# Patient Record
Sex: Female | Born: 1995 | Race: Black or African American | Hispanic: No | Marital: Single | Smoking: Current some day smoker
Health system: Southern US, Community
[De-identification: ages and names within clinical notes are randomized; demographics above are authoritative.]

## PROBLEM LIST (undated history)

## (undated) DIAGNOSIS — J45909 Unspecified asthma, uncomplicated: Secondary | ICD-10-CM

---
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
## 2016-08-14 ENCOUNTER — Emergency Department (HOSPITAL_COMMUNITY)
Admission: EM | Admit: 2016-08-14 | Discharge: 2016-08-14 | Disposition: A | Discharge status: Home / Self Care | Payer: No Typology Code available for payment source | Attending: Emergency Medicine | Admitting: Emergency Medicine

## 2016-08-14 ENCOUNTER — Encounter (HOSPITAL_COMMUNITY): Payer: Self-pay

## 2016-08-14 DIAGNOSIS — K029 Dental caries, unspecified: Secondary | ICD-10-CM | POA: Insufficient documentation

## 2016-08-14 DIAGNOSIS — J45909 Unspecified asthma, uncomplicated: Secondary | ICD-10-CM | POA: Insufficient documentation

## 2016-08-14 HISTORY — DX: Unspecified asthma, uncomplicated: J45.909

## 2016-08-14 MED ORDER — AMOXICILLIN 500 MG PO CAPS
500.0000 mg | ORAL_CAPSULE | Freq: Once | ORAL | Status: AC
  Administered 2016-08-14: 500 mg via ORAL
  Filled 2016-08-14: qty 1

## 2016-08-14 MED ORDER — AMOXICILLIN 500 MG PO CAPS: 1000.0000 mg | ORAL_CAPSULE | Freq: Two times a day (BID) | ORAL | 0 refills | Status: DC

## 2016-08-14 MED ORDER — BUPIVACAINE-EPINEPHRINE (PF) 0.5% -1:200000 IJ SOLN
1.8000 mL | Freq: Once | INTRAMUSCULAR | Status: AC
  Administered 2016-08-14: 1.8 mL
  Filled 2016-08-14: qty 1.8

## 2016-08-14 MED ORDER — HYDROCODONE-ACETAMINOPHEN 5-325 MG PO TABS: ORAL_TABLET | ORAL | 0 refills | Status: AC

## 2016-08-14 NOTE — ED Provider Notes (Signed)
[facility]-EMERGENCY DEPT Provider Note   CSN: [id number] Arrival date & time: 08/14/16  1134  By signing my name below, I, Claudia Blackwell, attest that this documentation has been prepared under the direction and in the presence of [employer], PA-C.  Electronically Signed: Rosario Blackwell, ED Scribe. 08/14/16. 1:12 PM.  History   Chief Complaint Chief Complaint  Patient presents with  . Dental Pain   The history is provided by the patient. No language interpreter was used.    HPI Comments: Claudia Blackwell is a 21 y.o. female who presents to the Emergency Department complaining of persistent, gradually worsening, right-sided, lower dental pain beginning last night. Pt describes their pain as throbbing. She reports associated facial swelling over the right lower jaw since the onset of her pain. She has been taking BC Powder without relief of her pain. Pt's pain is exacerbated with opening the mouth. They are currently followed by a dental specialist whom she has f/u w/ for extraction of the tooth. Pt denies fever, chills, or any other associated symptoms.   Past Medical History:  Diagnosis Date  . Asthma    There are no active problems to display for this patient.  History reviewed. No pertinent surgical history.  OB History    No data available     Home Medications    Prior to Admission medications   Medication Sig Start Date End Date Taking? Authorizing Provider  amoxicillin (AMOXIL) 500 MG capsule Take 2 capsules (1,000 mg total) by mouth 2 (two) times daily. 08/14/16   Cody Oliger, Joni Reining, PA-C  diclofenac (CATAFLAM) 50 MG tablet Take 1 tablet (50 mg total) by mouth 3 (three) times daily. As needed for back pain 04/29/12   Linna Hoff, MD  HYDROcodone-acetaminophen (NORCO/VICODIN) 5-325 MG tablet Take 1-2 tablets by mouth every 6 hours as needed for pain. 08/14/16   Atwell Mcdanel, Mardella Layman   Family History No family history on file.  Social History Social  History  Substance Use Topics  . Smoking status: Never Smoker  . Smokeless tobacco: Never Used  . Alcohol use No   Allergies   Patient has no known allergies.  Review of Systems Review of Systems   A complete review of systems was obtained and all systems are negative except as noted in the HPI and PMH.   Physical Exam Updated Vital Signs BP 118/67 (BP Location: Right Arm)   Pulse 60   Temp 98.5 F (36.9 C) (Oral)   Resp 18   Ht 5\' 7"  (1.702 m)   Wt 59.9 kg (132 lb)   LMP 08/12/2016   SpO2 100%   BMI 20.67 kg/m   Physical Exam  Constitutional: She is oriented to person, place, and time. She appears well-developed and well-nourished. No distress.  HENT:  Head: Normocephalic and atraumatic.  Mouth/Throat: Uvula is midline and oropharynx is clear and moist. No trismus in the jaw. No uvula swelling. No oropharyngeal exudate, posterior oropharyngeal edema, posterior oropharyngeal erythema or tonsillar abscesses.  Generally poor dentition, no gingival swelling, erythema or tenderness to palpation. Patient is handling their secretions. There is no tenderness to palpation or firmness underneath tongue bilaterally. No trismus.    Eyes: Conjunctivae and EOM are normal.  Neck: Normal range of motion.  Cardiovascular: Normal rate.   Pulmonary/Chest: Effort normal. No stridor.  Abdominal: She exhibits no distension.  Musculoskeletal: Normal range of motion.  Lymphadenopathy:    She has no cervical adenopathy.  Neurological: She is alert and oriented  to person, place, and time.  Skin: No pallor.  Psychiatric: She has a normal mood and affect. Her behavior is normal.  Nursing note and vitals reviewed.  ED Treatments / Results  DIAGNOSTIC STUDIES: Oxygen Saturation is 100% on RA, normal by my interpretation.   COORDINATION OF CARE: 1:12 PM-Discussed next steps with pt. Pt verbalized understanding and is agreeable with the plan.   Labs (all labs ordered are listed, but only  abnormal results are displayed) Labs Reviewed - No data to display  EKG  EKG Interpretation None      Radiology No results found.  Procedures Dental Block Date/Time: 08/14/2016 1:18 PM Performed by: Wynetta Emery Authorized by: Wynetta Emery   Consent:    Consent obtained:  Verbal   Consent given by:  Patient   Risks discussed:  Hematoma, nerve damage and pain   Alternatives discussed:  No treatment and delayed treatment Universal protocol:    Procedure explained and questions answered to patient or proxy's satisfaction: yes     Relevant documents present and verified: yes     Test results available and properly labeled: yes     Imaging studies available: yes     Required blood products, implants, devices, and special equipment available: yes     Site/side marked: yes     Immediately prior to procedure, a time out was called: yes     Patient identity confirmed:  Verbally with patient, arm band and provided demographic data Indications:    Indications: dental pain   Location:    Block type:  Inferior alveolar   Laterality:  Right Procedure details (see MAR for exact dosages):    Syringe type:  Luer lock syringe   Needle gauge:  27 G   Anesthetic injected:  Bupivacaine 0.5% WITH epi   Injection procedure:  Anatomic landmarks identified, anatomic landmarks palpated, introduced needle, negative aspiration for blood and incremental injection Post-procedure details:    Outcome:  Pain relieved   Patient tolerance of procedure:  Tolerated well, no immediate complications     Medications Ordered in ED Medications  bupivacaine-epinephrine (MARCAINE W/ EPI) 0.5% -1:200000 injection 1.8 mL (1.8 mLs Infiltration Given by Other 08/14/16 1325)  amoxicillin (AMOXIL) capsule 500 mg (500 mg Oral Given 08/14/16 1330)   Initial Impression / Assessment and Plan / ED Course  I have reviewed the triage vital signs and the nursing notes.  Pertinent labs & imaging results that  were available during my care of the patient were reviewed by me and considered in my medical decision making (see chart for details).    Vitals:   08/14/16 1140 08/14/16 1141 08/14/16 1334  BP:  122/77 118/67  Pulse:  71 60  Resp:  18 18  Temp:  98.5 F (36.9 C)   TempSrc:  Oral   SpO2:  100% 100%  Weight: 59.9 kg (132 lb)    Height: 5\' 7"  (1.702 m)      Medications  bupivacaine-epinephrine (MARCAINE W/ EPI) 0.5% -1:200000 injection 1.8 mL (1.8 mLs Infiltration Given by Other 08/14/16 1325)  amoxicillin (AMOXIL) capsule 500 mg (500 mg Oral Given 08/14/16 1330)    JOSLYNNE KLATT is 21 y.o. female presenting with severe dental pain and extreme dental caries. She states that she has a dentist, there is no signs of deep space infection or systemic infection. Dental block given and patient started on amoxicillin.  Evaluation does not show pathology that would require ongoing emergent intervention or inpatient treatment. Pt is hemodynamically  stable and mentating appropriately. Discussed findings and plan with patient/guardian, who agrees with care plan. All questions answered. Return precautions discussed and outpatient follow up given.      Final Clinical Impressions(s) / ED Diagnoses   Final diagnoses:  Pain due to dental caries   New Prescriptions Discharge Medication List as of 08/14/2016  1:22 PM    START taking these medications   Details  amoxicillin (AMOXIL) 500 MG capsule Take 2 capsules (1,000 mg total) by mouth 2 (two) times daily., Starting Fri 08/14/2016, Print    HYDROcodone-acetaminophen (NORCO/VICODIN) 5-325 MG tablet Take 1-2 tablets by mouth every 6 hours as needed for pain., Print       I personally performed the services described in this documentation, which was scribed in my presence. The recorded information has been reviewed and is accurate.     Kaylyn Limisciotta, Anita Laguna, PA-C 08/15/16 [id number]    Vanetta MuldersZackowski, Scott, MD 08/27/16 [phone]

## 2016-08-14 NOTE — ED Notes (Signed)
Dental supplies at bedside.

## 2016-08-14 NOTE — Discharge Instructions (Signed)
Take vicodin for breakthrough pain, do not drink alcohol, drive, care for children or do other critical tasks while taking vicodin. ° °Please follow with your primary care doctor in the next 2 days for a check-up. They must obtain records for further management.  ° °Do not hesitate to return to the Emergency Department for any new, worsening or concerning symptoms.  ° °

## 2016-08-14 NOTE — ED Notes (Signed)
EDP at bedside  

## 2016-08-14 NOTE — ED Notes (Signed)
EDP made aware that dental supplies are ready.

## 2016-08-14 NOTE — ED Triage Notes (Signed)
Pt reports right lower dental pain, onset last night

## 2016-08-17 ENCOUNTER — Inpatient Hospital Stay (HOSPITAL_BASED_OUTPATIENT_CLINIC_OR_DEPARTMENT_OTHER)
Admission: EM | Admit: 2016-08-17 | Discharge: 2016-08-20 | DRG: 138 | Disposition: A | Discharge status: Home / Self Care | Payer: Self-pay | Attending: Internal Medicine | Admitting: Internal Medicine

## 2016-08-17 ENCOUNTER — Encounter (HOSPITAL_BASED_OUTPATIENT_CLINIC_OR_DEPARTMENT_OTHER): Payer: Self-pay | Admitting: Emergency Medicine

## 2016-08-17 ENCOUNTER — Emergency Department (HOSPITAL_BASED_OUTPATIENT_CLINIC_OR_DEPARTMENT_OTHER): Payer: Self-pay

## 2016-08-17 DIAGNOSIS — E876 Hypokalemia: Secondary | ICD-10-CM | POA: Diagnosis present

## 2016-08-17 DIAGNOSIS — K122 Cellulitis and abscess of mouth: Principal | ICD-10-CM | POA: Diagnosis present

## 2016-08-17 DIAGNOSIS — K047 Periapical abscess without sinus: Secondary | ICD-10-CM | POA: Diagnosis present

## 2016-08-17 LAB — BASIC METABOLIC PANEL
Anion gap: 10 (ref 5–15)
BUN: 12 mg/dL (ref 6–20)
CO2: 27 mmol/L (ref 22–32)
CREATININE: 0.75 mg/dL (ref 0.44–1.00)
Calcium: 9.6 mg/dL (ref 8.9–10.3)
Chloride: 98 mmol/L — ABNORMAL LOW (ref 101–111)
GFR calc Af Amer: >60 mL/min (ref >60)
Glucose, Bld: 93 mg/dL (ref 65–99)
Potassium: 3.3 mmol/L — ABNORMAL LOW (ref 3.5–5.1)
Sodium: 135 mmol/L (ref 135–145)

## 2016-08-17 LAB — CBC WITH DIFFERENTIAL/PLATELET
Basophils Absolute: 0 10*3/uL (ref 0.0–0.1)
Basophils Relative: 0 %
EOS PCT: 0 %
Eosinophils Absolute: 0 10*3/uL (ref 0.0–0.7)
HCT: 35.5 % — ABNORMAL LOW (ref 36.0–46.0)
Hemoglobin: 12.4 g/dL (ref 12.0–15.0)
LYMPHS ABS: 1 10*3/uL (ref 0.7–4.0)
Lymphocytes Relative: 8 %
MCH: 30.6 pg (ref 26.0–34.0)
MCHC: 34.9 g/dL (ref 30.0–36.0)
MCV: 87.7 fL (ref 78.0–100.0)
MONO ABS: 0.8 10*3/uL (ref 0.1–1.0)
MONOS PCT: 7 %
Neutro Abs: 9.7 10*3/uL — ABNORMAL HIGH (ref 1.7–7.7)
Neutrophils Relative %: 85 %
PLATELETS: 279 10*3/uL (ref 150–400)
RBC: 4.05 MIL/uL (ref 3.87–5.11)
RDW: 11.6 % (ref 11.5–15.5)
WBC: 11.5 10*3/uL — ABNORMAL HIGH (ref 4.0–10.5)

## 2016-08-17 MED ORDER — ACETAMINOPHEN 650 MG RE SUPP
650.0000 mg | Freq: Four times a day (QID) | RECTAL | Status: DC | PRN
Start: 2016-08-17 — End: 2016-08-20

## 2016-08-17 MED ORDER — POTASSIUM CHLORIDE CRYS ER 10 MEQ PO TBCR
30.0000 meq | EXTENDED_RELEASE_TABLET | Freq: Once | ORAL | Status: AC
  Administered 2016-08-17: 30 meq via ORAL
  Filled 2016-08-17: qty 1

## 2016-08-17 MED ORDER — MORPHINE SULFATE (PF) 4 MG/ML IV SOLN
4.0000 mg | INTRAVENOUS | Status: DC | PRN
  Administered 2016-08-17 (×2): 4 mg via INTRAVENOUS
  Filled 2016-08-17 (×2): qty 1

## 2016-08-17 MED ORDER — MORPHINE SULFATE (PF) 4 MG/ML IV SOLN
2.0000 mg | INTRAVENOUS | Status: DC | PRN
  Administered 2016-08-18: 4 mg via INTRAVENOUS
  Administered 2016-08-19 (×2): 2 mg via INTRAVENOUS
  Filled 2016-08-17 (×3): qty 1

## 2016-08-17 MED ORDER — ONDANSETRON HCL 4 MG/2ML IJ SOLN
4.0000 mg | Freq: Once | INTRAMUSCULAR | Status: AC
  Administered 2016-08-17: 4 mg via INTRAVENOUS
  Filled 2016-08-17: qty 2

## 2016-08-17 MED ORDER — POLYETHYLENE GLYCOL 3350 17 G PO PACK: 17.0000 g | PACK | Freq: Every day | ORAL | Status: DC | PRN

## 2016-08-17 MED ORDER — ACETAMINOPHEN 325 MG PO TABS
650.0000 mg | ORAL_TABLET | Freq: Four times a day (QID) | ORAL | Status: DC | PRN
  Administered 2016-08-20: 650 mg via ORAL
  Filled 2016-08-17: qty 2

## 2016-08-17 MED ORDER — ONDANSETRON HCL 4 MG/2ML IJ SOLN
4.0000 mg | Freq: Four times a day (QID) | INTRAMUSCULAR | Status: DC | PRN
  Administered 2016-08-18: 4 mg via INTRAVENOUS
  Filled 2016-08-17: qty 2

## 2016-08-17 MED ORDER — SODIUM CHLORIDE 0.9 % IV SOLN
3.0000 g | Freq: Four times a day (QID) | INTRAVENOUS | Status: DC
  Administered 2016-08-17 – 2016-08-20 (×11): 3 g via INTRAVENOUS
  Filled 2016-08-17 (×13): qty 3

## 2016-08-17 MED ORDER — CLINDAMYCIN PHOSPHATE 900 MG/50ML IV SOLN
900.0000 mg | Freq: Once | INTRAVENOUS | Status: AC
  Administered 2016-08-17: 900 mg via INTRAVENOUS
  Filled 2016-08-17: qty 50

## 2016-08-17 MED ORDER — IOPAMIDOL (ISOVUE-300) INJECTION 61%
100.0000 mL | Freq: Once | INTRAVENOUS | Status: AC | PRN
  Administered 2016-08-17: 75 mL via INTRAVENOUS

## 2016-08-17 MED ORDER — SODIUM CHLORIDE 0.9 % IV BOLUS (SEPSIS)
500.0000 mL | Freq: Once | INTRAVENOUS | Status: AC
  Administered 2016-08-17: 500 mL via INTRAVENOUS

## 2016-08-17 MED ORDER — SODIUM CHLORIDE 0.9 % IV SOLN: INTRAVENOUS | Status: AC

## 2016-08-17 MED ORDER — DEXAMETHASONE SODIUM PHOSPHATE 10 MG/ML IJ SOLN
10.0000 mg | Freq: Once | INTRAMUSCULAR | Status: AC
  Administered 2016-08-17: 10 mg via INTRAVENOUS
  Filled 2016-08-17: qty 1

## 2016-08-17 NOTE — ED Provider Notes (Signed)
[facility]   CSN: [id number] Arrival date & time: 08/17/16  1325     History   Chief Complaint Chief Complaint  Patient presents with  . Oral Swelling    HPI Claudia Blackwell is a 21 y.o. female. Chief complaint is mouth, and neck pain and swelling  HPI: is a 21 year old female seen and evaluated here 2 days ago with left lower mandibular molar tooth pain. Placed on amoxicillin which she has been taking 48 hours. Underwent an inferior alveolar nerve block. Presents today stating she has pain in the floor of her mouth. Was unable to swallow some soup at work. He is feeling like she has having trouble swallowing without pain. She is not drooling. She is not stridorous. She denies difficulty breathing. She prefers not to swallow due to pain but is able to.  Past Medical History:  Diagnosis Date  . Asthma     There are no active problems to display for this patient.   History reviewed. No pertinent surgical history.  OB History    No data available       Home Medications    Prior to Admission medications   Medication Sig Start Date End Date Taking? Authorizing Provider  amoxicillin (AMOXIL) 500 MG capsule Take 2 capsules (1,000 mg total) by mouth 2 (two) times daily. 08/14/16   Pisciotta, Joni Reining, PA-C  diclofenac (CATAFLAM) 50 MG tablet Take 1 tablet (50 mg total) by mouth 3 (three) times daily. As needed for back pain 04/29/12   Linna Hoff, MD  HYDROcodone-acetaminophen (NORCO/VICODIN) 5-325 MG tablet Take 1-2 tablets by mouth every 6 hours as needed for pain. 08/14/16   Pisciotta, Mardella Layman    Family History History reviewed. No pertinent family history.  Social History Social History  Substance Use Topics  . Smoking status: Never Smoker  . Smokeless tobacco: Never Used  . Alcohol use No     Allergies   Patient has no known allergies.   Review of Systems Review of Systems  Constitutional: Negative for appetite change,  chills, diaphoresis, fatigue and fever.  HENT: Negative for mouth sores, sore throat and trouble swallowing.        Mouth, and neck pain.  Eyes: Negative for visual disturbance.  Respiratory: Negative for cough, chest tightness, shortness of breath and wheezing.   Cardiovascular: Negative for chest pain.  Gastrointestinal: Negative for abdominal distention, abdominal pain, diarrhea, nausea and vomiting.  Endocrine: Negative for polydipsia, polyphagia and polyuria.  Genitourinary: Negative for dysuria, frequency and hematuria.  Musculoskeletal: Negative for gait problem.  Skin: Negative for color change, pallor and rash.  Neurological: Negative for dizziness, syncope, light-headedness and headaches.  Hematological: Does not bruise/bleed easily.  Psychiatric/Behavioral: Negative for behavioral problems and confusion.     Physical Exam Updated Vital Signs BP (!) 129/91 (BP Location: Right Arm)   Pulse 93   Temp 99.2 F (37.3 C) (Oral)   Resp (!) 22   Ht 5\' 7"  (1.702 m)   Wt 59.9 kg (132 lb)   LMP 08/12/2016   SpO2 99%   BMI 20.67 kg/m   Physical Exam  Constitutional: She is oriented to person, place, and time. She appears well-developed and well-nourished. No distress.  HENT:  Head: Normocephalic.    Mouth/Throat:    Eyes: Conjunctivae are normal. Pupils are equal, round, and reactive to light. No scleral icterus.  Neck: Normal range of motion. Neck supple. No thyromegaly present.  Cardiovascular: Normal rate and regular rhythm.  Exam reveals no gallop and no friction rub.   No murmur heard. Pulmonary/Chest: Effort normal and breath sounds normal. No respiratory distress. She has no wheezes. She has no rales.  Abdominal: Soft. Bowel sounds are normal. She exhibits no distension. There is no tenderness. There is no rebound.  Musculoskeletal: Normal range of motion.  Neurological: She is alert and oriented to person, place, and time.  Skin: Skin is warm and dry. No rash  noted.  Psychiatric: She has a normal mood and affect. Her behavior is normal.     ED Treatments / Results  Labs (all labs ordered are listed, but only abnormal results are displayed) Labs Reviewed  CBC WITH DIFFERENTIAL/PLATELET - Abnormal; Notable for the following:       Result Value   WBC 11.5 (*)    HCT 35.5 (*)    Neutro Abs 9.7 (*)    All other components within normal limits  BASIC METABOLIC PANEL - Abnormal; Notable for the following:    Potassium 3.3 (*)    Chloride 98 (*)    All other components within normal limits    EKG  EKG Interpretation None       Radiology Ct Soft Tissue Neck W Contrast  Result Date: 08/17/2016 CLINICAL DATA:  Recent dental procedure. Right jaw and neck swelling with pain. Right mandibular molar pain soft tissue swelling. Rule out Ludwig angina. EXAM: CT NECK WITH CONTRAST TECHNIQUE: Multidetector CT imaging of the neck was performed using the standard protocol following the bolus administration of intravenous contrast. CONTRAST:  75mL ISOVUE-300 IOPAMIDOL (ISOVUE-300) INJECTION 61% COMPARISON:  None. FINDINGS: Pharynx and larynx: Mild hypertrophy of the tonsils bilaterally. No peritonsillar abscess. No pharyngeal mass. Normal airway. Larynx normal. Salivary glands: No inflammation, mass, or stone. Thyroid: Negative Lymph nodes: Mild reactive adenopathy in the neck bilaterally, due to infection Vascular:  Arterial and venous patency is present Limited intracranial: Negative Visualized orbits: Negative Mastoids and visualized paranasal sinuses: Negative Skeleton: Caries right lower molar with minimal periapical lucency. No bony destruction. Caries also present left upper molar. No apparent apical abscess. Negative for osteomyelitis of the mandible. Upper chest: Negative Other: Soft tissue swelling in the floor of the mouth on the right. The mylohyoid is enlarged on the right. There is a small rim enhancing fluid collection in the posterior right sub  lingual space which may represent an abscess measuring approximately 5 x 15 mm. In addition, there is an enhancing submental lymph node measuring under 1 cm with mild stranding of the platysmas muscle and subcutaneous tissue in the right submandibular region. IMPRESSION: Soft tissue swelling floor of mouth on the right with enlargement of the right mylohyoid muscle. 5 x 15 mm rim enhancing fluid collection right posterior sublingual space consistent with small abscess. Findings compatible with floor of the mouth infection. These results were called by telephone at the time of interpretation on 08/17/2016 at 3:14 pm to Dr. Rolland Porter , who verbally acknowledged these results. Electronically Signed   By: Marlan Palau M.D.   On: 08/17/2016 15:15    Procedures Procedures (including critical care time)  Medications Ordered in ED Medications  morphine 4 MG/ML injection 4 mg (4 mg Intravenous Given 08/17/16 1526)  clindamycin (CLEOCIN) IVPB 900 mg (900 mg Intravenous New Bag/Given 08/17/16 1526)  dexamethasone (DECADRON) injection 10 mg (10 mg Intravenous Given 08/17/16 1524)  ondansetron (ZOFRAN) injection 4 mg (4 mg Intravenous Given 08/17/16 1524)  sodium chloride 0.9 % bolus 500 mL (500 mLs Intravenous  New Bag/Given 08/17/16 1527)  iopamidol (ISOVUE-300) 61 % injection 100 mL (75 mLs Intravenous Contrast Given 08/17/16 1449)     Initial Impression / Assessment and Plan / ED Course  I have reviewed the triage vital signs and the nursing notes.  Pertinent labs & imaging results that were available during my care of the patient were reviewed by me and considered in my medical decision making (see chart for details).    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 She has periapical abscess at base of right second molar. Not exactly intimate with this she has separate 5 mm, by 15 mm for mouth abscess. She has induration and phlegmon of mild chronic musculature. Airway is widely patent.  Upon  arrival. IV was placed. Given morphine and Zofran, Decadron, clindamycin 900 mg.  I discussed the case with oral surgeon Dr. Barbette MerinoJensen. He will see the patient consultation. He does request [facility] as [facility]. I placed a call to hospitalist regarding admission.  Final Clinical Impressions(s) / ED Diagnoses   Final diagnoses:  Ludwig's angina syndrome    New Prescriptions New Prescriptions   No medications on file     Rolland PorterJames, Kailon Treese, MD 08/17/16 1557

## 2016-08-17 NOTE — ED Notes (Signed)
Patient transported to CT 

## 2016-08-17 NOTE — Progress Notes (Signed)
    [facility] for treatment of oral cavity soft tissue abscess and significant soft tissue swelling with preservation of airway. EP, Dr. Rolland PorterMark James, has discussed case with oral surgery team who agreed to see the patient in consultation at time of arrival [facility] under observation status.   Shelly Flattenavid Merrell, MD [facility] 08/17/2016, 4:37 PM

## 2016-08-17 NOTE — H&P (Addendum)
History and Physical    Claudia Blackwell ZOX:[id number] DOB: 03-01-1996 DOA: 08/17/2016  PCP: Patient, No Pcp Per   Patient coming from: Home, by way of [facility]   Chief Complaint: Right mouth and jaw pain, fevers  HPI: Claudia Blackwell is a 21 y.o. female with medical history significant for asthma in childhood, now presenting in transfer from [facility] where she presented with pain and swelling at her right mandibular premolar region and under her chin. She also reports fevers for the past 2 nights. Patient states that she had been in her usual state of health until approximately 08/13/2016 when she noted severe pain and swelling at the lower right jaw and under her chin. She was evaluated in the emergency department the following day, given a dental block and discharged with amoxicillin and narcotic analgesia. Patient reports that she has been taking these medications as directed, but the pain and swelling has continued to worsen, making it very difficult for her to eat or drink due to pain. She denies any significant dyspnea and denies any noisy breathing. She reports fevers at home for the past 2 nights associated with chills. She denies any chest pain, palpitations, dyspnea, cough, or lightheadedness. No vomiting or diarrhea reported.  [facility] ED Course: Upon arrival to the [facility] ED, patient is found to be afebrile, saturating well on room air, and with vital signs stable. Chemistry panels notable for potassium of 3.3 and CBC features a mild leukocytosis with WBC 11,500. CT of the neck soft tissues reveal soft tissue swelling at the floor the mouth on the right side with enlarged right mild hyaloid muscle and rim-enhancing fluid collection at the right posterior sublingual space consistent with a small abscess. She was treated with IV clindamycin, Decadron 10 mg IV, morphine, Zofran, and 500 mL of normal saline in the ED. ED physician consulted with oral surgery  who advised a medical admission over to [facility] where they will see the patient in consultation. Patient has been transferred to [facility] where she continues to be hemodynamically stable and in no apparent respiratory distress. She will be observed on the medical/surgical unit for ongoing evaluation and management of oral abscess with fevers at home.  Review of Systems:  All other systems reviewed and apart from HPI, are negative.  Past Medical History:  Diagnosis Date  . Asthma     History reviewed. No pertinent surgical history.   reports that she has never smoked. She has never used smokeless tobacco. She reports that she does not drink alcohol or use drugs.  No Known Allergies  History reviewed. No pertinent family history.   Prior to Admission medications   Medication Sig Start Date End Date Taking? Authorizing Provider  amoxicillin (AMOXIL) 500 MG capsule Take 2 capsules (1,000 mg total) by mouth 2 (two) times daily. 08/14/16   Pisciotta, Joni Reining, PA-C  HYDROcodone-acetaminophen (NORCO/VICODIN) 5-325 MG tablet Take 1-2 tablets by mouth every 6 hours as needed for pain. 08/14/16   Pisciotta, Joni Reining, PA-C    Physical Exam: Vitals:   08/17/16 1331 08/17/16 1553 08/17/16 1728 08/17/16 1830  BP:  126/76 113/65 119/64  Pulse:  83 74 78  Resp:  18 16 16   Temp:    98.5 F (36.9 C)  TempSrc:    Axillary  SpO2:  100% 100% 100%  Weight: 59.9 kg (132 lb)     Height: 5\' 7"  (1.702 m)  Constitutional: NAD, calm, in apparent discomfort.  Eyes: PERTLA, lids and conjunctivae normal ENMT: Mucous membranes are moist. Unable to open mouth much secondary to pain. Swelling and tenderness at anterior inferior aspect of mandible, just right of center. No overlying cellulitis or drainage.    Neck: normal, supple, no masses, no thyromegaly Respiratory: clear to auscultation bilaterally, no wheezing, no crackles. Normal respiratory effort.   Cardiovascular: S1 & S2  heard, regular rate and rhythm. No extremity edema. No significant JVD. Abdomen: No distension, no tenderness, no masses palpated. Bowel sounds normal.  Musculoskeletal: no clubbing / cyanosis. No joint deformity upper and lower extremities.    Skin: no significant rashes, lesions, ulcers. Warm, dry, well-perfused. Neurologic: CN 2-12 grossly intact. Sensation intact, DTR normal. Strength 5/5 in all 4 limbs.  Psychiatric: Alert and oriented x 3. Calm and cooperative.     Labs on Admission: I have personally reviewed following labs and imaging studies  CBC:  Recent Labs Lab 08/17/16 1443  WBC 11.5*  NEUTROABS 9.7*  HGB 12.4  HCT 35.5*  MCV 87.7  PLT 279   Basic Metabolic Panel:  Recent Labs Lab 08/17/16 1443  NA 135  K 3.3*  CL 98*  CO2 27  GLUCOSE 93  BUN 12  CREATININE 0.75  CALCIUM 9.6   GFR: Estimated Creatinine Clearance: 104.3 mL/min (by C-G formula based on SCr of 0.75 mg/dL). Liver Function Tests: No results for input(s): AST, ALT, ALKPHOS, BILITOT, PROT, ALBUMIN in the last 168 hours. No results for input(s): LIPASE, AMYLASE in the last 168 hours. No results for input(s): AMMONIA in the last 168 hours. Coagulation Profile: No results for input(s): INR, PROTIME in the last 168 hours. Cardiac Enzymes: No results for input(s): CKTOTAL, CKMB, CKMBINDEX, TROPONINI in the last 168 hours. BNP (last 3 results) No results for input(s): PROBNP in the last 8760 hours. HbA1C: No results for input(s): HGBA1C in the last 72 hours. CBG: No results for input(s): GLUCAP in the last 168 hours. Lipid Profile: No results for input(s): CHOL, HDL, LDLCALC, TRIG, CHOLHDL, LDLDIRECT in the last 72 hours. Thyroid Function Tests: No results for input(s): TSH, T4TOTAL, FREET4, T3FREE, THYROIDAB in the last 72 hours. Anemia Panel: No results for input(s): VITAMINB12, FOLATE, FERRITIN, TIBC, IRON, RETICCTPCT in the last 72 hours. Urine analysis: No results found for:  COLORURINE, APPEARANCEUR, LABSPEC, PHURINE, GLUCOSEU, HGBUR, BILIRUBINUR, KETONESUR, PROTEINUR, UROBILINOGEN, NITRITE, LEUKOCYTESUR Sepsis Labs: @LABRCNTIP (procalcitonin:4,lacticidven:4) )No results found for this or any previous visit (from the past 240 hour(s)).   Radiological Exams on Admission: Ct Soft Tissue Neck W Contrast  Result Date: 08/17/2016 CLINICAL DATA:  Recent dental procedure. Right jaw and neck swelling with pain. Right mandibular molar pain soft tissue swelling. Rule out Ludwig angina. EXAM: CT NECK WITH CONTRAST TECHNIQUE: Multidetector CT imaging of the neck was performed using the standard protocol following the bolus administration of intravenous contrast. CONTRAST:  75mL ISOVUE-300 IOPAMIDOL (ISOVUE-300) INJECTION 61% COMPARISON:  None. FINDINGS: Pharynx and larynx: Mild hypertrophy of the tonsils bilaterally. No peritonsillar abscess. No pharyngeal mass. Normal airway. Larynx normal. Salivary glands: No inflammation, mass, or stone. Thyroid: Negative Lymph nodes: Mild reactive adenopathy in the neck bilaterally, due to infection Vascular:  Arterial and venous patency is present Limited intracranial: Negative Visualized orbits: Negative Mastoids and visualized paranasal sinuses: Negative Skeleton: Caries right lower molar with minimal periapical lucency. No bony destruction. Caries also present left upper molar. No apparent apical abscess. Negative for osteomyelitis of the mandible. Upper chest: Negative Other: Soft tissue  swelling in the floor of the mouth on the right. The mylohyoid is enlarged on the right. There is a small rim enhancing fluid collection in the posterior right sub lingual space which may represent an abscess measuring approximately 5 x 15 mm. In addition, there is an enhancing submental lymph node measuring under 1 cm with mild stranding of the platysmas muscle and subcutaneous tissue in the right submandibular region. IMPRESSION: Soft tissue swelling floor of  mouth on the right with enlargement of the right mylohyoid muscle. 5 x 15 mm rim enhancing fluid collection right posterior sublingual space consistent with small abscess. Findings compatible with floor of the mouth infection. These results were called by telephone at the time of interpretation on 08/17/2016 at 3:14 pm to Dr. Rolland PorterMARK JAMES , who verbally acknowledged these results. Electronically Signed   By: Marlan Palauharles  Clark M.D.   On: 08/17/2016 15:15    EKG: Not performed.   Assessment/Plan  1. Oral abscess  - Pt presents with pain and swelling at right anterior mandible, reports fever at home - She has been taking amoxicillin and Norco at home since 08/14/16 but reports worsening in sxs  - She is found to have mild leukocytosis, no fever here, CT with soft-tissue edema in floor or mouth on right with enlarged right myohyloid muscle and small abscess in right sublingual space  - Not classic Ludwig's as involvement is isolated to right side  - Airway is patent; no drooling or stridor, no distress  - She was treated with clindamycin 900 mg IV in ED  - Oral surgery is consulting and much appreciated; will follow-up on recommendations   - Plan to continue empiric abx with Unasyn, continue pain-control with prn morphine   2. Hypokalemia - Serum potassium is 3.3 on admission - She was treated with 30 mEq oral potassium  - Repeat chem panel in am     DVT prophylaxis: SCD's  Code Status: Full  Family Communication: Discussed with patient Disposition Plan: Observe on med-surg Consults called: Oral surgeon Admission status: Observation   Briscoe Deutscherimothy S Tazaria Dlugosz, MD [facility] Pager [phone]  If 7PM-7AM, please contact night-coverage [email or web address] Password [email or web address]  08/17/2016, 7:17 PM

## 2016-08-17 NOTE — ED Triage Notes (Signed)
Patient reports that she was seen and treated for a tooth problem earlier this week. She was given 2 medications and has been taking them. The patient reports that she woke up @ 4 am and took a vidcodin because of the tooth ache. She reports that she is also have swelling to her right jaw and to her neck. Patient states that while she was on her way to work she started to have some dizziness and thought is was because she had not eaten. When she got to work she was eating some soup and "it wouldn't go down"  and she felt like she was choking on it. Patient is taking deep breaths in Triage. No audible stridor noted

## 2016-08-18 ENCOUNTER — Encounter (HOSPITAL_COMMUNITY)
Admission: EM | Disposition: A | Discharge status: Home / Self Care | Payer: Self-pay | Source: Home / Self Care | Attending: Internal Medicine

## 2016-08-18 ENCOUNTER — Inpatient Hospital Stay (HOSPITAL_COMMUNITY): Payer: Self-pay | Admitting: Certified Registered Nurse Anesthetist

## 2016-08-18 ENCOUNTER — Encounter (HOSPITAL_COMMUNITY): Payer: Self-pay | Admitting: Certified Registered Nurse Anesthetist

## 2016-08-18 DIAGNOSIS — E876 Hypokalemia: Secondary | ICD-10-CM

## 2016-08-18 DIAGNOSIS — K122 Cellulitis and abscess of mouth: Secondary | ICD-10-CM

## 2016-08-18 HISTORY — PX: TOOTH EXTRACTION: SHX859

## 2016-08-18 HISTORY — PX: OTHER SURGICAL HISTORY: SHX169

## 2016-08-18 LAB — BASIC METABOLIC PANEL
Anion gap: 7 (ref 5–15)
BUN: 9 mg/dL (ref 6–20)
CHLORIDE: 103 mmol/L (ref 101–111)
CO2: 23 mmol/L (ref 22–32)
Calcium: 8.9 mg/dL (ref 8.9–10.3)
Creatinine, Ser: 0.72 mg/dL (ref 0.44–1.00)
GFR calc non Af Amer: >60 mL/min (ref >60)
Glucose, Bld: 123 mg/dL — ABNORMAL HIGH (ref 65–99)
POTASSIUM: 4.1 mmol/L (ref 3.5–5.1)
SODIUM: 133 mmol/L — AB (ref 135–145)

## 2016-08-18 LAB — CBC WITH DIFFERENTIAL/PLATELET
BASOS ABS: 0 10*3/uL (ref 0.0–0.1)
Basophils Relative: 0 %
EOS PCT: 0 %
Eosinophils Absolute: 0 10*3/uL (ref 0.0–0.7)
HCT: 32.2 % — ABNORMAL LOW (ref 36.0–46.0)
Hemoglobin: 10.6 g/dL — ABNORMAL LOW (ref 12.0–15.0)
LYMPHS ABS: 0.9 10*3/uL (ref 0.7–4.0)
LYMPHS PCT: 10 %
MCH: 28.7 pg (ref 26.0–34.0)
MCHC: 32.9 g/dL (ref 30.0–36.0)
MCV: 87.3 fL (ref 78.0–100.0)
Monocytes Absolute: 0.8 10*3/uL (ref 0.1–1.0)
Monocytes Relative: 9 %
NEUTROS PCT: 81 %
Neutro Abs: 7.1 10*3/uL (ref 1.7–7.7)
PLATELETS: 275 10*3/uL (ref 150–400)
RBC: 3.69 MIL/uL — AB (ref 3.87–5.11)
RDW: 11.8 % (ref 11.5–15.5)
WBC: 8.7 10*3/uL (ref 4.0–10.5)

## 2016-08-18 LAB — MAGNESIUM: MAGNESIUM: 1.6 mg/dL — AB (ref 1.7–2.4)

## 2016-08-18 LAB — HIV ANTIBODY (ROUTINE TESTING W REFLEX): HIV SCREEN 4TH GENERATION: NONREACTIVE

## 2016-08-18 SURGERY — EXTRACTION, TOOTH, MOLAR
Anesthesia: General

## 2016-08-18 MED ORDER — LACTATED RINGERS IV SOLN
INTRAVENOUS | Status: DC
  Administered 2016-08-18: 11:00:00 via INTRAVENOUS

## 2016-08-18 MED ORDER — LIDOCAINE 2% (20 MG/ML) 5 ML SYRINGE
INTRAMUSCULAR | Status: DC | PRN
  Administered 2016-08-18: 100 mg via INTRAVENOUS

## 2016-08-18 MED ORDER — KETOROLAC TROMETHAMINE 30 MG/ML IJ SOLN
30.0000 mg | Freq: Once | INTRAMUSCULAR | Status: DC | PRN
  Administered 2016-08-18: 30 mg via INTRAVENOUS

## 2016-08-18 MED ORDER — PROPOFOL 10 MG/ML IV BOLUS
INTRAVENOUS | Status: DC | PRN
  Administered 2016-08-18: 150 mg via INTRAVENOUS

## 2016-08-18 MED ORDER — FENTANYL CITRATE (PF) 250 MCG/5ML IJ SOLN
INTRAMUSCULAR | Status: DC | PRN
  Administered 2016-08-18: 125 ug via INTRAVENOUS
  Administered 2016-08-18: 25 ug via INTRAVENOUS

## 2016-08-18 MED ORDER — MIDAZOLAM HCL 2 MG/2ML IJ SOLN
INTRAMUSCULAR | Status: DC | PRN
  Administered 2016-08-18 (×2): 1 mg via INTRAVENOUS

## 2016-08-18 MED ORDER — SODIUM CHLORIDE 0.9 % IV SOLN
INTRAVENOUS | Status: AC
  Administered 2016-08-18 (×2): via INTRAVENOUS

## 2016-08-18 MED ORDER — FENTANYL CITRATE (PF) 100 MCG/2ML IJ SOLN
INTRAMUSCULAR | Status: AC
  Filled 2016-08-18: qty 2

## 2016-08-18 MED ORDER — PROMETHAZINE HCL 25 MG/ML IJ SOLN: 6.2500 mg | INTRAMUSCULAR | Status: DC | PRN

## 2016-08-18 MED ORDER — MIDAZOLAM HCL 2 MG/2ML IJ SOLN
INTRAMUSCULAR | Status: AC
  Filled 2016-08-18: qty 2

## 2016-08-18 MED ORDER — PROPOFOL 10 MG/ML IV BOLUS
INTRAVENOUS | Status: AC
Start: 2016-08-18 — End: 2016-08-18
  Filled 2016-08-18: qty 20

## 2016-08-18 MED ORDER — LIDOCAINE-EPINEPHRINE 2 %-1:100000 IJ SOLN
INTRAMUSCULAR | Status: DC | PRN
  Administered 2016-08-18: 20 mL via INTRADERMAL

## 2016-08-18 MED ORDER — OXYCODONE-ACETAMINOPHEN 5-325 MG PO TABS
1.0000 | ORAL_TABLET | ORAL | Status: DC | PRN
  Administered 2016-08-18: 2 via ORAL
  Filled 2016-08-18: qty 2

## 2016-08-18 MED ORDER — DEXAMETHASONE SODIUM PHOSPHATE 10 MG/ML IJ SOLN
INTRAMUSCULAR | Status: DC | PRN
  Administered 2016-08-18: 10 mg via INTRAVENOUS

## 2016-08-18 MED ORDER — OXYCODONE HCL 5 MG/5ML PO SOLN
5.0000 mg | ORAL | Status: DC | PRN
  Administered 2016-08-19 – 2016-08-20 (×4): 5 mg via ORAL
  Filled 2016-08-18 (×6): qty 5

## 2016-08-18 MED ORDER — LIDOCAINE-EPINEPHRINE 1 %-1:100000 IJ SOLN
INTRAMUSCULAR | Status: AC
  Filled 2016-08-18: qty 1

## 2016-08-18 MED ORDER — NEOSTIGMINE METHYLSULFATE 10 MG/10ML IV SOLN
INTRAVENOUS | Status: DC | PRN
  Administered 2016-08-18: 3 mg via INTRAVENOUS

## 2016-08-18 MED ORDER — GLYCOPYRROLATE 0.2 MG/ML IJ SOLN
INTRAMUSCULAR | Status: DC | PRN
  Administered 2016-08-18: 0.4 mg via INTRAVENOUS

## 2016-08-18 MED ORDER — SUCCINYLCHOLINE CHLORIDE 20 MG/ML IJ SOLN
INTRAMUSCULAR | Status: DC | PRN
  Administered 2016-08-18: 100 mg via INTRAVENOUS

## 2016-08-18 MED ORDER — ROCURONIUM BROMIDE 10 MG/ML (PF) SYRINGE
PREFILLED_SYRINGE | INTRAVENOUS | Status: DC | PRN
  Administered 2016-08-18: 20 mg via INTRAVENOUS

## 2016-08-18 MED ORDER — ONDANSETRON HCL 4 MG/2ML IJ SOLN
INTRAMUSCULAR | Status: DC | PRN
  Administered 2016-08-18: 4 mg via INTRAVENOUS

## 2016-08-18 MED ORDER — FENTANYL CITRATE (PF) 100 MCG/2ML IJ SOLN
25.0000 ug | INTRAMUSCULAR | Status: DC | PRN
  Administered 2016-08-18: 50 ug via INTRAVENOUS

## 2016-08-18 MED ORDER — KETOROLAC TROMETHAMINE 30 MG/ML IJ SOLN
INTRAMUSCULAR | Status: AC
  Filled 2016-08-18: qty 1

## 2016-08-18 MED ORDER — MEPERIDINE HCL 25 MG/ML IJ SOLN: 6.2500 mg | INTRAMUSCULAR | Status: DC | PRN

## 2016-08-18 MED ORDER — SODIUM CHLORIDE 0.9 % IR SOLN
Status: DC | PRN
  Administered 2016-08-18: 1

## 2016-08-18 MED ORDER — OXYMETAZOLINE HCL 0.05 % NA SOLN
NASAL | Status: AC
  Filled 2016-08-18: qty 15

## 2016-08-18 MED ORDER — FENTANYL CITRATE (PF) 250 MCG/5ML IJ SOLN
INTRAMUSCULAR | Status: AC
  Filled 2016-08-18: qty 5

## 2016-08-18 SURGICAL SUPPLY — 34 items
BLADE SURG 15 STRL LF DISP TIS (BLADE) ×1 IMPLANT
BLADE SURG 15 STRL SS (BLADE) ×3
BUR CROSS CUT FISSURE 1.6 (BURR) ×2 IMPLANT
BUR CROSS CUT FISSURE 1.6MM (BURR) ×1
CANISTER SUCT 3000ML PPV (MISCELLANEOUS) ×3 IMPLANT
COVER SURGICAL LIGHT HANDLE (MISCELLANEOUS) ×3 IMPLANT
DECANTER SPIKE VIAL GLASS SM (MISCELLANEOUS) IMPLANT
DRAIN PENROSE 1/4X12 LTX STRL (WOUND CARE) ×2 IMPLANT
GAUZE PACKING FOLDED 2  STR (GAUZE/BANDAGES/DRESSINGS) ×2
GAUZE PACKING FOLDED 2 STR (GAUZE/BANDAGES/DRESSINGS) ×1 IMPLANT
GAUZE SPONGE 4X4 16PLY XRAY LF (GAUZE/BANDAGES/DRESSINGS) IMPLANT
GLOVE BIO SURGEON STRL SZ 6.5 (GLOVE) ×2 IMPLANT
GLOVE BIO SURGEON STRL SZ7.5 (GLOVE) ×3 IMPLANT
GLOVE BIO SURGEONS STRL SZ 6.5 (GLOVE) ×1
GLOVE BIOGEL PI IND STRL 7.0 (GLOVE) ×1 IMPLANT
GLOVE BIOGEL PI INDICATOR 7.0 (GLOVE) ×2
GOWN STRL REUS W/ TWL LRG LVL3 (GOWN DISPOSABLE) ×1 IMPLANT
GOWN STRL REUS W/ TWL XL LVL3 (GOWN DISPOSABLE) ×1 IMPLANT
GOWN STRL REUS W/TWL LRG LVL3 (GOWN DISPOSABLE) ×3
GOWN STRL REUS W/TWL XL LVL3 (GOWN DISPOSABLE) ×3
KIT BASIN OR (CUSTOM PROCEDURE TRAY) ×3 IMPLANT
KIT ROOM TURNOVER OR (KITS) ×3 IMPLANT
NEEDLE 22X1 1/2 (OR ONLY) (NEEDLE) ×3 IMPLANT
NS IRRIG 1000ML POUR BTL (IV SOLUTION) ×3 IMPLANT
PAD ARMBOARD 7.5X6 YLW CONV (MISCELLANEOUS) ×6 IMPLANT
SUT CHROMIC 3 0 PS 2 (SUTURE) ×6 IMPLANT
SUT SILK 3 0 SH 30 (SUTURE) ×2 IMPLANT
SWAB CULTURE LIQ STUART DBL (MISCELLANEOUS) ×2 IMPLANT
SWAB CULTURE LIQUID MINI MALE (MISCELLANEOUS) ×2 IMPLANT
SYR CONTROL 10ML LL (SYRINGE) ×3 IMPLANT
TOWEL OR 17X26 10 PK STRL BLUE (TOWEL DISPOSABLE) ×3 IMPLANT
TRAY ENT MC OR (CUSTOM PROCEDURE TRAY) ×3 IMPLANT
TUBING IRRIGATION (MISCELLANEOUS) IMPLANT
YANKAUER SUCT BULB TIP NO VENT (SUCTIONS) ×3 IMPLANT

## 2016-08-18 NOTE — Op Note (Signed)
NAME:  Claudia Blackwell, Claudia Blackwell           ACCOUNT NO.:  [id number]  MEDICAL RECORD NO.:  [id number]  LOCATION:  [id number]                         FACILITY:  [facility]  PHYSICIAN:  Georgia LopesScott M. Nakina Spatz, M.D.  DATE OF BIRTH:  1996-02-22  DATE OF PROCEDURE:  08/18/2016 DATE OF DISCHARGE:                              OPERATIVE REPORT   PREOPERATIVE DIAGNOSIS:  Right sublingual space infection, abscess, tooth #31.  POSTOPERATIVE DIAGNOSIS:  Right sublingual space infection, abscess, tooth #31.  PROCEDURES PERFORMED:  Extraction of tooth #31.  Incision and drainage of right sublingual space infection.  SURGEON:  Georgia LopesScott M. Timiko Offutt, M.D.  ANESTHESIA:  General nasal intubation.  DESCRIPTION OF PROCEDURE:  The patient was taken to the operating room, placed on the table in supine position.  General anesthesia was administered and an oral endotracheal tube was placed and secured.  The eyes were protected.  The patient was draped for the procedure.  Time- out was performed.  The posterior pharynx was suctioned.  A throat pack was placed.  A 2% lidocaine with 1:100,000 epinephrine was infiltrated in an inferior alveolar block in the right mandible and buccally and lingually around tooth #31 as well as in the lingual sulcus adjacent to tooth #31.  A #15 blade was used to make an incision around tooth #31. The periosteum was elevated with a periosteal elevator and then the tooth was elevated with a 301 dental elevator and removed from the mouth with a #17 forceps.  Upon removal of the tooth, a small amount of purulent exudate was noted in the distal socket.  Decision was made to continue with incision and drainage.  A 15 blade was used to make a mucosal incision lingually adjacent to tooth #31 in the floor of the mouth.  The hemostats were used to dissect bluntly until purulent exudate was encountered.  This was sent for cultures, aerobic and anaerobic.  The area was manipulated until it was felt that all  the purulence had been removed.  Then, the area was irrigated and a quarter- inch Penrose was placed into the infected site and sutured to the overlying mucosa with 3-0 silk.  Then, the oral cavity was irrigated and suctioned.  Throat pack was removed.  The patient was left in the care of anesthesia for awakening and transportation to recovery room with return to the floor to follow.  ESTIMATED BLOOD LOSS:  Minimal.  COMPLICATIONS:  None.  SPECIMEN:  None.  DRAINS:  Quarter-inch Penrose drain.     Georgia LopesScott M. Falcon Mccaskey, M.D.     SMJ/MEDQ  D:  08/18/2016  T:  08/18/2016  Job:  [id number]

## 2016-08-18 NOTE — Anesthesia Preprocedure Evaluation (Addendum)
Anesthesia Evaluation  Patient identified by MRN, date of birth, ID band Patient awake    Reviewed: Allergy & Precautions, NPO status , Patient's Chart, lab work & pertinent test results  Airway Mallampati: I       Dental no notable dental hx. (+) Teeth Intact   Pulmonary    Pulmonary exam normal breath sounds clear to auscultation       Cardiovascular negative cardio ROS Normal cardiovascular exam Rhythm:Regular Rate:Normal     Neuro/Psych negative neurological ROS  negative psych ROS   GI/Hepatic negative GI ROS, Neg liver ROS,   Endo/Other  negative endocrine ROS  Renal/GU negative Renal ROS  negative genitourinary   Musculoskeletal negative musculoskeletal ROS (+)   Abdominal Normal abdominal exam  (+)   Peds  Hematology  (+) Blood dyscrasia, anemia ,   Anesthesia Other Findings   Reproductive/Obstetrics                            Anesthesia Physical Anesthesia Plan  ASA: II  Anesthesia Plan: General   Post-op Pain Management:    Induction: Intravenous  PONV Risk Score and Plan: 3 and Ondansetron, Dexamethasone, Propofol and Midazolam  Airway Management Planned: Oral ETT  Additional Equipment:   Intra-op Plan:   Post-operative Plan: Extubation in OR  Informed Consent: I have reviewed the patients History and Physical, chart, labs and discussed the procedure including the risks, benefits and alternatives for the proposed anesthesia with the patient or authorized representative who has indicated his/her understanding and acceptance.   Dental advisory given  Plan Discussed with: CRNA and Surgeon  Anesthesia Plan Comments:         Anesthesia Quick Evaluation

## 2016-08-18 NOTE — Progress Notes (Signed)
[facility] PROGRESS NOTE  Claudia Blackwell AVW:[id number] DOB: 27-Dec-1995 DOA: 08/17/2016   PCP: Patient, No Pcp Per     Assessment/Plan: Principal Problem:   Oral abscess Active Problems:   Hypokalemia   Ludwig's angina syndrome   21 year old female , who presented with right lower molar pain and swelling under the tongue Seen at [facility] approx 08/13/2016 for pain and swelling right floor of mouth/jaw. Rx'd amoxicillin and pain meds. Swelling continued to worsen. Seen yesterday at [facility] and diagnosed with "Ludwigs Angina Syndrome". Since then has been given IV cleocin/Unasyn and swelling has reduced markedly. Seen by oral surgery  Assessment/Plan  1. Oral abscess  - Pt presents with pain and swelling at right anterior mandible,  Afebrile since admission - She has been taking amoxicillin and Norco at home since 08/14/16 but reports worsening in sxs  - Leukocytosis improved 11.5>8.7, CT scan shows soft tissue edema in floor or mouth on right with enlarged right myohyloid muscle and small abscess in right sublingual space  -Patient has been evaluated by oral surgery Dr. Barbette Merino, he recommends incision and drainage 6/19 - Continue IV Unasyn, day #2 - She was treated with clindamycin 900 mg IV in ED     2. Hypokalemia Repleted  DVT prophylaxsis Lovenox.  After surgery  Code Status:  Full code   Family Communication: Discussed in detail with the patient, all imaging results, lab results explained to the patient   Disposition Plan:  1-2 days      Consultants:  Oral surgery  Procedures:  None  Antibiotics: Anti-infectives    Start     Dose/Rate Route Frequency Ordered Stop   08/17/16 1930  Ampicillin-Sulbactam (UNASYN) 3 g in sodium chloride 0.9 % 100 mL IVPB     3 g 200 mL/hr over 30 Minutes Intravenous Every 6 hours 08/17/16 1854     08/17/16 1430  clindamycin (CLEOCIN) IVPB 900 mg     900 mg 100 mL/hr over 30 Minutes  Intravenous  Once 08/17/16 1416 08/17/16 1724         HPI/Subjective: Complaining of pain and swelling in the right lower molar  Objective: Vitals:   08/17/16 1728 08/17/16 1830 08/17/16 2300 08/18/16 0500  BP: 113/65 119/64 (!) 113/56 109/65  Pulse: 74 78 69 (!) 51  Resp: 16 16 16 16   Temp:  98.5 F (36.9 C) 98.6 F (37 C) 97.7 F (36.5 C)  TempSrc:  Axillary Oral Oral  SpO2: 100% 100% 94% 94%  Weight:      Height:        Intake/Output Summary (Last 24 hours) at 08/18/16 0905 Last data filed at 08/18/16 0400  Gross per 24 hour  Intake          1183.33 ml  Output                0 ml  Net          1183.33 ml    Exam:  Examination:  General exam:  Mild tenderness in the right submandibular area Respiratory system: Clear to auscultation. Respiratory effort normal. Cardiovascular system: S1 & S2 heard, RRR. No JVD, murmurs, rubs, gallops or clicks. No pedal edema. Gastrointestinal system: Abdomen is nondistended, soft and nontender. No organomegaly or masses felt. Normal bowel sounds heard. Central nervous system: Alert and oriented. No focal neurological deficits. Extremities: Symmetric 5 x 5 power. Skin: No rashes, lesions or ulcers Psychiatry: Judgement and  insight appear normal. Mood & affect appropriate.     Data Reviewed: I have personally reviewed following labs and imaging studies  Micro Results No results found for this or any previous visit (from the past 240 hour(s)).  Radiology Reports Ct Soft Tissue Neck W Contrast  Result Date: 08/17/2016 CLINICAL DATA:  Recent dental procedure. Right jaw and neck swelling with pain. Right mandibular molar pain soft tissue swelling. Rule out Ludwig angina. EXAM: CT NECK WITH CONTRAST TECHNIQUE: Multidetector CT imaging of the neck was performed using the standard protocol following the bolus administration of intravenous contrast. CONTRAST:  75mL ISOVUE-300 IOPAMIDOL (ISOVUE-300) INJECTION 61% COMPARISON:  None.  FINDINGS: Pharynx and larynx: Mild hypertrophy of the tonsils bilaterally. No peritonsillar abscess. No pharyngeal mass. Normal airway. Larynx normal. Salivary glands: No inflammation, mass, or stone. Thyroid: Negative Lymph nodes: Mild reactive adenopathy in the neck bilaterally, due to infection Vascular:  Arterial and venous patency is present Limited intracranial: Negative Visualized orbits: Negative Mastoids and visualized paranasal sinuses: Negative Skeleton: Caries right lower molar with minimal periapical lucency. No bony destruction. Caries also present left upper molar. No apparent apical abscess. Negative for osteomyelitis of the mandible. Upper chest: Negative Other: Soft tissue swelling in the floor of the mouth on the right. The mylohyoid is enlarged on the right. There is a small rim enhancing fluid collection in the posterior right sub lingual space which may represent an abscess measuring approximately 5 x 15 mm. In addition, there is an enhancing submental lymph node measuring under 1 cm with mild stranding of the platysmas muscle and subcutaneous tissue in the right submandibular region. IMPRESSION: Soft tissue swelling floor of mouth on the right with enlargement of the right mylohyoid muscle. 5 x 15 mm rim enhancing fluid collection right posterior sublingual space consistent with small abscess. Findings compatible with floor of the mouth infection. These results were called by telephone at the time of interpretation on 08/17/2016 at 3:14 pm to Dr. Rolland PorterMARK JAMES , who verbally acknowledged these results. Electronically Signed   By: Marlan Palauharles  Clark M.D.   On: 08/17/2016 15:15     CBC  Recent Labs Lab 08/17/16 1443 08/18/16 0359  WBC 11.5* 8.7  HGB 12.4 10.6*  HCT 35.5* 32.2*  PLT 279 275  MCV 87.7 87.3  MCH 30.6 28.7  MCHC 34.9 32.9  RDW 11.6 11.8  LYMPHSABS 1.0 0.9  MONOABS 0.8 0.8  EOSABS 0.0 0.0  BASOSABS 0.0 0.0    Chemistries   Recent Labs Lab 08/17/16 1443  08/18/16 0359  NA 135 133*  K 3.3* 4.1  CL 98* 103  CO2 27 23  GLUCOSE 93 123*  BUN 12 9  CREATININE 0.75 0.72  CALCIUM 9.6 8.9  MG  --  1.6*   ------------------------------------------------------------------------------------------------------------------ estimated creatinine clearance is 104.3 mL/min (by C-G formula based on SCr of 0.72 mg/dL). ------------------------------------------------------------------------------------------------------------------ No results for input(s): HGBA1C in the last 72 hours. ------------------------------------------------------------------------------------------------------------------ No results for input(s): CHOL, HDL, LDLCALC, TRIG, CHOLHDL, LDLDIRECT in the last 72 hours. ------------------------------------------------------------------------------------------------------------------ No results for input(s): TSH, T4TOTAL, T3FREE, THYROIDAB in the last 72 hours.  Invalid input(s): FREET3 ------------------------------------------------------------------------------------------------------------------ No results for input(s): VITAMINB12, FOLATE, FERRITIN, TIBC, IRON, RETICCTPCT in the last 72 hours.  Coagulation profile No results for input(s): INR, PROTIME in the last 168 hours.  No results for input(s): DDIMER in the last 72 hours.  Cardiac Enzymes No results for input(s): CKMB, TROPONINI, MYOGLOBIN in the last 168 hours.  Invalid input(s): CK ------------------------------------------------------------------------------------------------------------------ Invalid input(s): POCBNP  CBG: No results for input(s): GLUCAP in the last 168 hours.     Studies: Ct Soft Tissue Neck W Contrast  Result Date: 08/17/2016 CLINICAL DATA:  Recent dental procedure. Right jaw and neck swelling with pain. Right mandibular molar pain soft tissue swelling. Rule out Ludwig angina. EXAM: CT NECK WITH CONTRAST TECHNIQUE: Multidetector CT imaging  of the neck was performed using the standard protocol following the bolus administration of intravenous contrast. CONTRAST:  75mL ISOVUE-300 IOPAMIDOL (ISOVUE-300) INJECTION 61% COMPARISON:  None. FINDINGS: Pharynx and larynx: Mild hypertrophy of the tonsils bilaterally. No peritonsillar abscess. No pharyngeal mass. Normal airway. Larynx normal. Salivary glands: No inflammation, mass, or stone. Thyroid: Negative Lymph nodes: Mild reactive adenopathy in the neck bilaterally, due to infection Vascular:  Arterial and venous patency is present Limited intracranial: Negative Visualized orbits: Negative Mastoids and visualized paranasal sinuses: Negative Skeleton: Caries right lower molar with minimal periapical lucency. No bony destruction. Caries also present left upper molar. No apparent apical abscess. Negative for osteomyelitis of the mandible. Upper chest: Negative Other: Soft tissue swelling in the floor of the mouth on the right. The mylohyoid is enlarged on the right. There is a small rim enhancing fluid collection in the posterior right sub lingual space which may represent an abscess measuring approximately 5 x 15 mm. In addition, there is an enhancing submental lymph node measuring under 1 cm with mild stranding of the platysmas muscle and subcutaneous tissue in the right submandibular region. IMPRESSION: Soft tissue swelling floor of mouth on the right with enlargement of the right mylohyoid muscle. 5 x 15 mm rim enhancing fluid collection right posterior sublingual space consistent with small abscess. Findings compatible with floor of the mouth infection. These results were called by telephone at the time of interpretation on 08/17/2016 at 3:14 pm to Dr. Rolland Porter , who verbally acknowledged these results. Electronically Signed   By: Marlan Palau M.D.   On: 08/17/2016 15:15      No results found for: HGBA1C Lab Results  Component Value Date   CREATININE 0.72 08/18/2016       Scheduled  Meds: Continuous Infusions: . ampicillin-sulbactam (UNASYN) IV 3 g (08/18/16 0832)     LOS: 0 days    Time spent: >30 MINS    Richarda Overlie  [facility] Pager [phone]. If 7PM-7AM, please contact night-coverage at [email or web address], password [facility] 08/18/2016, 9:05 AM  LOS: 0 days

## 2016-08-18 NOTE — Consult Note (Signed)
Claudia Blackwell is an 21 y.o. female.  CC: pain/swelling right lower molar, swelling under tongue   HPI: Seen at [facility] approx 08/13/2016 for pain and swelling right floor of mouth/jaw. Rx'd amoxicillin and pain meds. Swelling continued to worsen. Seen yesterday at [facility] and diagnosed with "Ludwigs Angina Syndrome". Since then has been given IV cleocin/Unasyn and swelling has reduced markedly.  Past Medical History:  Diagnosis Date  . Asthma     History reviewed. No pertinent surgical history.  History reviewed. No pertinent family history.  Social History:  reports that she has never smoked. She has never used smokeless tobacco. She reports that she does not drink alcohol or use drugs.  Allergies: No Known Allergies  Medications: I have reviewed the patient's current medications.  Results for orders placed or performed during the hospital encounter of 08/17/16 (from the past 48 hour(s))  CBC with Differential/Platelet     Status: Abnormal   Collection Time: 08/17/16  2:43 PM  Result Value Ref Range   WBC 11.5 (H) 4.0 - 10.5 K/uL   RBC 4.05 3.87 - 5.11 MIL/uL   Hemoglobin 12.4 12.0 - 15.0 g/dL   HCT 35.5 (L) 36.0 - 46.0 %   MCV 87.7 78.0 - 100.0 fL   MCH 30.6 26.0 - 34.0 pg   MCHC 34.9 30.0 - 36.0 g/dL   RDW 11.6 11.5 - 15.5 %   Platelets 279 150 - 400 K/uL   Neutrophils Relative % 85 %   Neutro Abs 9.7 (H) 1.7 - 7.7 K/uL   Lymphocytes Relative 8 %   Lymphs Abs 1.0 0.7 - 4.0 K/uL   Monocytes Relative 7 %   Monocytes Absolute 0.8 0.1 - 1.0 K/uL   Eosinophils Relative 0 %   Eosinophils Absolute 0.0 0.0 - 0.7 K/uL   Basophils Relative 0 %   Basophils Absolute 0.0 0.0 - 0.1 K/uL  Basic metabolic panel     Status: Abnormal   Collection Time: 08/17/16  2:43 PM  Result Value Ref Range   Sodium 135 135 - 145 mmol/L   Potassium 3.3 (L) 3.5 - 5.1 mmol/L   Chloride 98 (L) 101 - 111 mmol/L   CO2 27 22 - 32 mmol/L   Glucose, Bld 93 65 - 99  mg/dL   BUN 12 6 - 20 mg/dL   Creatinine, Ser 0.75 0.44 - 1.00 mg/dL   Calcium 9.6 8.9 - 10.3 mg/dL   GFR calc non Af Amer >60 >60 mL/min   GFR calc Af Amer >60 >60 mL/min    Comment: (NOTE) The eGFR has been calculated using the CKD EPI equation. This calculation has not been validated in all clinical situations. eGFR's persistently <60 mL/min signify possible Chronic Kidney Disease.    Anion gap 10 5 - 15  Basic metabolic panel     Status: Abnormal   Collection Time: 08/18/16  3:59 AM  Result Value Ref Range   Sodium 133 (L) 135 - 145 mmol/L   Potassium 4.1 3.5 - 5.1 mmol/L   Chloride 103 101 - 111 mmol/L   CO2 23 22 - 32 mmol/L   Glucose, Bld 123 (H) 65 - 99 mg/dL   BUN 9 6 - 20 mg/dL   Creatinine, Ser 0.72 0.44 - 1.00 mg/dL   Calcium 8.9 8.9 - 10.3 mg/dL   GFR calc non Af Amer >60 >60 mL/min   GFR calc Af Amer >60 >60 mL/min    Comment: (NOTE)  The eGFR has been calculated using the CKD EPI equation. This calculation has not been validated in all clinical situations. eGFR's persistently <60 mL/min signify possible Chronic Kidney Disease.    Anion gap 7 5 - 15  CBC WITH DIFFERENTIAL     Status: Abnormal   Collection Time: 08/18/16  3:59 AM  Result Value Ref Range   WBC 8.7 4.0 - 10.5 K/uL   RBC 3.69 (L) 3.87 - 5.11 MIL/uL   Hemoglobin 10.6 (L) 12.0 - 15.0 g/dL   HCT 32.2 (L) 36.0 - 46.0 %   MCV 87.3 78.0 - 100.0 fL   MCH 28.7 26.0 - 34.0 pg   MCHC 32.9 30.0 - 36.0 g/dL   RDW 11.8 11.5 - 15.5 %   Platelets 275 150 - 400 K/uL   Neutrophils Relative % 81 %   Neutro Abs 7.1 1.7 - 7.7 K/uL   Lymphocytes Relative 10 %   Lymphs Abs 0.9 0.7 - 4.0 K/uL   Monocytes Relative 9 %   Monocytes Absolute 0.8 0.1 - 1.0 K/uL   Eosinophils Relative 0 %   Eosinophils Absolute 0.0 0.0 - 0.7 K/uL   Basophils Relative 0 %   Basophils Absolute 0.0 0.0 - 0.1 K/uL  Magnesium     Status: Abnormal   Collection Time: 08/18/16  3:59 AM  Result Value Ref Range   Magnesium 1.6 (L) 1.7 -  2.4 mg/dL    Ct Soft Tissue Neck W Contrast  Result Date: 08/17/2016 CLINICAL DATA:  Recent dental procedure. Right jaw and neck swelling with pain. Right mandibular molar pain soft tissue swelling. Rule out Ludwig angina. EXAM: CT NECK WITH CONTRAST TECHNIQUE: Multidetector CT imaging of the neck was performed using the standard protocol following the bolus administration of intravenous contrast. CONTRAST:  34m ISOVUE-300 IOPAMIDOL (ISOVUE-300) INJECTION 61% COMPARISON:  None. FINDINGS: Pharynx and larynx: Mild hypertrophy of the tonsils bilaterally. No peritonsillar abscess. No pharyngeal mass. Normal airway. Larynx normal. Salivary glands: No inflammation, mass, or stone. Thyroid: Negative Lymph nodes: Mild reactive adenopathy in the neck bilaterally, due to infection Vascular:  Arterial and venous patency is present Limited intracranial: Negative Visualized orbits: Negative Mastoids and visualized paranasal sinuses: Negative Skeleton: Caries right lower molar with minimal periapical lucency. No bony destruction. Caries also present left upper molar. No apparent apical abscess. Negative for osteomyelitis of the mandible. Upper chest: Negative Other: Soft tissue swelling in the floor of the mouth on the right. The mylohyoid is enlarged on the right. There is a small rim enhancing fluid collection in the posterior right sub lingual space which may represent an abscess measuring approximately 5 x 15 mm. In addition, there is an enhancing submental lymph node measuring under 1 cm with mild stranding of the platysmas muscle and subcutaneous tissue in the right submandibular region. IMPRESSION: Soft tissue swelling floor of mouth on the right with enlargement of the right mylohyoid muscle. 5 x 15 mm rim enhancing fluid collection right posterior sublingual space consistent with small abscess. Findings compatible with floor of the mouth infection. These results were called by telephone at the time of  interpretation on 08/17/2016 at 3:14 pm to Dr. MTanna Furry, who verbally acknowledged these results. Electronically Signed   By: CFranchot GalloM.D.   On: 08/17/2016 15:15    ROS Blood pressure 109/65, pulse (!) 51, temperature 97.7 F (36.5 C), temperature source Oral, resp. rate 16, height _0  (1.702 m), weight 132 lb (59.9 kg), last menstrual period 08/12/2016, SpO2  94 %. General appearance: alert, cooperative and no distress Head: Normocephalic, without obvious abnormality, atraumatic Eyes: negative Nose: Nares normal. Septum midline. Mucosa normal. No drainage or sinus tenderness. Throat: Mild-moderate right sublingual edema. Carious molar right mandible. No purulent drainage. Pharynx clear. Mild trismus to 85m. Neck: no adenopathy, supple, symmetrical, trachea midline and mild tenderness right submandibular area.   Assessment/Plan: Right sublingual space abscess secondary to carious molar. Will need extraction of tooth with possible incision and drainage.  Continue antibiotics. Scheduled for OR 11:30am today.  Levora Werden M 08/18/2016, 7:49 AM

## 2016-08-18 NOTE — Op Note (Signed)
08/17/2016 - 08/18/2016  11:44 AM  PATIENT:  Christie NottinghamJaquasia M Mizuno  21 y.o. female  PRE-OPERATIVE DIAGNOSIS: Right sublingual space infection, abscessed tooth # 31  POST-OPERATIVE DIAGNOSIS:  SAME  PROCEDURE:  Procedure(s): EXTRACTION 31;  I&D RIGHT SUBLINGUAL SPACE INFECTION SURGEON:  Surgeon(s): Ocie DoyneJensen, Alexxus Sobh, DDS  ANESTHESIA:   local and general  EBL:  minimal  DRAINS: 1/4 " PENROSE RIGHT SUBLINGUAL  SPECIMEN:  No Specimen  COUNTS:  YES  PLAN OF CARE: Transfer to floor after PACU.   PATIENT DISPOSITION:  PACU - hemodynamically stable.   PROCEDURE DETAILS: Dictation # [id number]  Georgia LopesScott M. Aster Screws, DMD 08/18/2016 11:44 AM

## 2016-08-18 NOTE — Anesthesia Postprocedure Evaluation (Signed)
Anesthesia Post Note  Patient: [name]  Procedure(s) Performed: Procedure(s) (LRB): EXTRACTION 31 W/ POSSIBLE I&D (N/A)     Patient location during evaluation: PACU Anesthesia Type: General Level of consciousness: awake Pain management: pain level controlled Vital Signs Assessment: post-procedure vital signs reviewed and stable Respiratory status: spontaneous breathing Cardiovascular status: stable Postop Assessment: no signs of nausea or vomiting Anesthetic complications: no    Last Vitals:  Vitals:   08/18/16 1245 08/18/16 1255  BP: 114/78 117/79  Pulse: (!) 58 (!) 56  Resp: 15 14  Temp:  36.6 C    Last Pain:  Vitals:   08/18/16 1230  TempSrc:   PainSc: 10-Worst pain ever   Pain Goal: Patients Stated Pain Goal: 4 (08/18/16 1215)               Atalia Litzinger JR,JOHN Susann GivensFRANKLIN

## 2016-08-18 NOTE — Anesthesia Procedure Notes (Signed)
Procedure Name: Intubation Date/Time: 08/18/2016 11:34 AM Performed by: Burt EkURNER, Amere Bricco ASHLEY Pre-anesthesia Checklist: Patient identified, Emergency Drugs available, Suction available and Patient being monitored Patient Re-evaluated:Patient Re-evaluated prior to inductionOxygen Delivery Method: Circle system utilized Preoxygenation: Pre-oxygenation with 100% oxygen Intubation Type: IV induction Ventilation: Mask ventilation without difficulty Laryngoscope Size: Glidescope and 3 Grade View: Grade I Tube type: Oral Tube size: 7.0 mm Number of attempts: 1 Airway Equipment and Method: Stylet and Video-laryngoscopy Placement Confirmation: ETT inserted through vocal cords under direct vision,  positive ETCO2 and breath sounds checked- equal and bilateral Secured at: 22 cm Tube secured with: Tape Dental Injury: Teeth and Oropharynx as per pre-operative assessment  Comments: Glidescope used due to limited mouth opening due to pain and swelling of the abcess

## 2016-08-18 NOTE — OR Nursing (Signed)
Confirmed w/ OR Staff that penrose drain mentioned in MD px note was d/c'd before patient exited OR.

## 2016-08-18 NOTE — Transfer of Care (Signed)
Immediate Anesthesia Transfer of Care Note  Patient: Claudia Blackwell  Procedure(s) Performed: Procedure(s): EXTRACTION 31 W/ POSSIBLE I&D (N/A)  Patient Location: PACU  Anesthesia Type:General  Level of Consciousness: drowsy and patient cooperative  Airway & Oxygen Therapy: Patient Spontanous Breathing  Post-op Assessment: Report given to RN, Post -op Vital signs reviewed and stable and Patient moving all extremities X 4  Post vital signs: Reviewed and stable  Last Vitals:  Vitals:   08/18/16 0500 08/18/16 1014  BP: 109/65 114/69  Pulse: (!) 51 64  Resp: 16 17  Temp: 36.5 C 36.6 C    Last Pain:  Vitals:   08/18/16 1014  TempSrc: Oral  PainSc:          Complications: No apparent anesthesia complications

## 2016-08-19 ENCOUNTER — Encounter (HOSPITAL_COMMUNITY): Payer: Self-pay | Admitting: Oral Surgery

## 2016-08-19 LAB — CBC
HCT: 33.1 % — ABNORMAL LOW (ref 36.0–46.0)
Hemoglobin: 10.7 g/dL — ABNORMAL LOW (ref 12.0–15.0)
MCH: 29.1 pg (ref 26.0–34.0)
MCHC: 32.3 g/dL (ref 30.0–36.0)
MCV: 89.9 fL (ref 78.0–100.0)
Platelets: 305 10*3/uL (ref 150–400)
RBC: 3.68 MIL/uL — ABNORMAL LOW (ref 3.87–5.11)
RDW: 12.4 % (ref 11.5–15.5)
WBC: 12.4 10*3/uL — ABNORMAL HIGH (ref 4.0–10.5)

## 2016-08-19 LAB — I-STAT BETA HCG BLOOD, ED (NOT ORDERABLE)

## 2016-08-19 LAB — COMPREHENSIVE METABOLIC PANEL
ALBUMIN: 3.1 g/dL — AB (ref 3.5–5.0)
ALK PHOS: 48 U/L (ref 38–126)
ALT: 11 U/L — ABNORMAL LOW (ref 14–54)
ANION GAP: 6 (ref 5–15)
AST: 18 U/L (ref 15–41)
BILIRUBIN TOTAL: 0.6 mg/dL (ref 0.3–1.2)
BUN: 10 mg/dL (ref 6–20)
CALCIUM: 8.6 mg/dL — AB (ref 8.9–10.3)
CO2: 25 mmol/L (ref 22–32)
Chloride: 105 mmol/L (ref 101–111)
Creatinine, Ser: 0.8 mg/dL (ref 0.44–1.00)
GLUCOSE: 103 mg/dL — AB (ref 65–99)
Potassium: 4 mmol/L (ref 3.5–5.1)
Sodium: 136 mmol/L (ref 135–145)
TOTAL PROTEIN: 7.1 g/dL (ref 6.5–8.1)

## 2016-08-19 MED ORDER — MAGIC MOUTHWASH W/LIDOCAINE
5.0000 mL | Freq: Three times a day (TID) | ORAL | Status: DC | PRN
  Administered 2016-08-20: 5 mL via ORAL
  Filled 2016-08-19 (×3): qty 5

## 2016-08-19 NOTE — Progress Notes (Signed)
Claudia Blackwell PROGRESS NOTE:   SUBJECTIVE: Feeling better. Drain uncomfortable.   OBJECTIVE: Afebrile  Vitals: Blood pressure 105/65, pulse (!) 56, temperature 97.8 F (36.6 C), temperature source Oral, resp. rate 18, height 5\' 7"  (1.702 m), weight 132 lb (59.9 kg), last menstrual period 08/12/2016, SpO2 96 %. Lab results: Results for orders placed or performed during the hospital encounter of 08/17/16 (from the past 24 hour(s))  Aerobic/Anaerobic Culture (surgical/deep wound)     Status: None (Preliminary result)   Collection Time: 08/18/16 11:41 AM  Result Value Ref Range   Specimen Description ABSCESS    Special Requests      RIGHT SUBLINGUAL INFECTION PATIENT ON FOLLOWING UNASYN   Gram Stain      MODERATE WBC PRESENT, PREDOMINANTLY PMN MODERATE GRAM POSITIVE COCCI IN PAIRS FEW GRAM NEGATIVE COCCOBACILLI FEW GRAM POSITIVE RODS    Culture PENDING    Report Status PENDING   Comprehensive metabolic panel     Status: Abnormal   Collection Time: 08/19/16  4:47 AM  Result Value Ref Range   Sodium 136 135 - 145 mmol/L   Potassium 4.0 3.5 - 5.1 mmol/L   Chloride 105 101 - 111 mmol/L   CO2 25 22 - 32 mmol/L   Glucose, Bld 103 (H) 65 - 99 mg/dL   BUN 10 6 - 20 mg/dL   Creatinine, Ser 4.090.80 0.44 - 1.00 mg/dL   Calcium 8.6 (L) 8.9 - 10.3 mg/dL   Total Protein 7.1 6.5 - 8.1 g/dL   Albumin 3.1 (L) 3.5 - 5.0 g/dL   AST 18 15 - 41 U/L   ALT 11 (L) 14 - 54 U/L   Alkaline Phosphatase 48 38 - 126 U/L   Total Bilirubin 0.6 0.3 - 1.2 mg/dL   GFR calc non Af Amer >60 >60 mL/min   GFR calc Af Amer >60 >60 mL/min   Anion gap 6 5 - 15  CBC     Status: Abnormal   Collection Time: 08/19/16  4:47 AM  Result Value Ref Range   WBC 12.4 (H) 4.0 - 10.5 K/uL   RBC 3.68 (L) 3.87 - 5.11 MIL/uL   Hemoglobin 10.7 (L) 12.0 - 15.0 g/dL   HCT 81.133.1 (L) 91.436.0 - 78.246.0 %   MCV 89.9 78.0 - 100.0 fL   MCH 29.1 26.0 - 34.0 pg   MCHC 32.3 30.0 - 36.0 g/dL   RDW 95.612.4 21.311.5 - 08.615.5 %   Platelets 305 150 -  400 K/uL   Radiology Results: Ct Soft Tissue Neck W Contrast  Result Date: 08/17/2016 CLINICAL DATA:  Recent dental procedure. Right jaw and neck swelling with pain. Right mandibular molar pain soft tissue swelling. Rule out Ludwig angina. EXAM: CT NECK WITH CONTRAST TECHNIQUE: Multidetector CT imaging of the neck was performed using the standard protocol following the bolus administration of intravenous contrast. CONTRAST:  75mL ISOVUE-300 IOPAMIDOL (ISOVUE-300) INJECTION 61% COMPARISON:  None. FINDINGS: Pharynx and larynx: Mild hypertrophy of the tonsils bilaterally. No peritonsillar abscess. No pharyngeal mass. Normal airway. Larynx normal. Salivary glands: No inflammation, mass, or stone. Thyroid: Negative Lymph nodes: Mild reactive adenopathy in the neck bilaterally, due to infection Vascular:  Arterial and venous patency is present Limited intracranial: Negative Visualized orbits: Negative Mastoids and visualized paranasal sinuses: Negative Skeleton: Caries right lower molar with minimal periapical lucency. No bony destruction. Caries also present left upper molar. No apparent apical abscess. Negative for osteomyelitis of the mandible. Upper chest: Negative Other: Soft tissue swelling in the floor  of the mouth on the right. The mylohyoid is enlarged on the right. There is a small rim enhancing fluid collection in the posterior right sub lingual space which may represent an abscess measuring approximately 5 x 15 mm. In addition, there is an enhancing submental lymph node measuring under 1 cm with mild stranding of the platysmas muscle and subcutaneous tissue in the right submandibular region. IMPRESSION: Soft tissue swelling floor of mouth on the right with enlargement of the right mylohyoid muscle. 5 x 15 mm rim enhancing fluid collection right posterior sublingual space consistent with small abscess. Findings compatible with floor of the mouth infection. These results were called by telephone at the time  of interpretation on 08/17/2016 at 3:14 pm to Dr. Rolland Porter , who verbally acknowledged these results. Electronically Signed   By: Marlan Palau M.D.   On: 08/17/2016 15:15   General appearance: alert, cooperative and no distress Head: Normocephalic, without obvious abnormality, atraumatic Eyes: negative Nose: Nares normal. Septum midline. Mucosa normal. No drainage or sinus tenderness. Throat: drain intact right sublingual with slight amount of drainage present. Extraction socket with clot. Minimal edema, pharynx clear. less trismus. Neck: no adenopathy, no carotid bruit and thyroid not enlarged, symmetric, no tenderness/mass/nodules  ASSESSMENT: Resolving right sublingual space infection s/p I&D, extraction tooth.   PLAN:. Pt stable for d/c today from oral surgery standpoint.  Will remove drain tomorrow at office or on floor if pt still admitted.   Claudia Blackwell M 08/19/2016

## 2016-08-19 NOTE — Progress Notes (Signed)
[facility] PROGRESS NOTE  Claudia Blackwell ZOX:[id number] DOB: [birth year] DOA: 08/17/2016   PCP: Patient, No Pcp Per     Assessment/Plan: Principal Problem:   Oral abscess Active Problems:   Hypokalemia   Ludwig's angina syndrome   21 year old female , who presented with right lower molar pain and swelling under the tongue Seen at [facility] approx 08/13/2016 for pain and swelling right floor of mouth/jaw. Rx'd amoxicillin and pain meds. Swelling continued to worsen. Seen yesterday at [facility] and diagnosed with "Ludwigs Angina Syndrome". Since then has been given IV cleocin/Unasyn and swelling has reduced markedly. Seen by oral surgery  Assessment/Plan  1. Right sublingual space infection, dental abscess - Pt presents with pain and swelling at right anterior mandible,    - She has been taking amoxicillin and Norco at home since 08/14/16 but reports worsening in sxs  - Leukocytosis improved 11.5>8.7, CT scan shows soft tissue edema in floor or mouth on right with enlarged right myohyloid muscle and small abscess in right sublingual space  -Patient   evaluated by oral surgery Dr. Barbette Merino, now status post surgery on 6/19 incision and drainage, extraction of  tooth #31 - Continue IV Unasyn, day #3, Penrose drain in place  magic mouth wash with lidocaine for oral/dental pain    2. Hypokalemia Repleted   DVT prophylaxsis Lovenox.  After surgery  Code Status:  Full code   Family Communication: Discussed in detail with the patient, all imaging results, lab results explained to the patient   Disposition Plan: Anticipate discharge in the next 1-2 days       Consultants:  Oral surgery  Procedures:  None  Antibiotics: Anti-infectives    Start     Dose/Rate Route Frequency Ordered Stop   08/17/16 1930  Ampicillin-Sulbactam (UNASYN) 3 g in sodium chloride 0.9 % 100 mL IVPB     3 g 200 mL/hr over 30 Minutes Intravenous Every 6 hours  08/17/16 1854     08/17/16 1430  clindamycin (CLEOCIN) IVPB 900 mg     900 mg 100 mL/hr over 30 Minutes Intravenous  Once 08/17/16 1416 08/17/16 1724         HPI/Subjective: Complaining of " stinging" pain and swelling in the right lower molar  Objective: Vitals:   08/18/16 1400 08/18/16 2147 08/19/16 0057 08/19/16 0357  BP: 112/63 111/69 (!) 105/55 105/65  Pulse: (!) 54 (!) 48 (!) 50 (!) 56  Resp: 15 18 18 18   Temp: 97.7 F (36.5 C) 98.1 F (36.7 C) 98.1 F (36.7 C) 97.8 F (36.6 C)  TempSrc: Oral Oral Oral Oral  SpO2: 98% 94% 96% 96%  Weight:      Height:        Intake/Output Summary (Last 24 hours) at 08/19/16 8119 Last data filed at 08/19/16 1478  Gross per 24 hour  Intake             1975 ml  Output                2 ml  Net             1973 ml    Exam:  Examination:  General exam:  Mild tenderness in the right submandibular area, Penrose drain in place Respiratory system: Clear to auscultation. Respiratory effort normal. Cardiovascular system: S1 & S2 heard, RRR. No JVD, murmurs, rubs, gallops or clicks. No pedal edema. Gastrointestinal system: Abdomen is nondistended, soft and  nontender. No organomegaly or masses felt. Normal bowel sounds heard. Central nervous system: Alert and oriented. No focal neurological deficits. Extremities: Symmetric 5 x 5 power. Skin: No rashes, lesions or ulcers Psychiatry: Judgement and insight appear normal. Mood & affect appropriate.     Data Reviewed: I have personally reviewed following labs and imaging studies  Micro Results Recent Results (from the past 240 hour(s))  Aerobic/Anaerobic Culture (surgical/deep wound)     Status: None (Preliminary result)   Collection Time: 08/18/16 11:41 AM  Result Value Ref Range Status   Specimen Description ABSCESS  Final   Special Requests   Final    RIGHT SUBLINGUAL INFECTION PATIENT ON FOLLOWING UNASYN   Gram Stain   Final    MODERATE WBC PRESENT, PREDOMINANTLY PMN MODERATE  GRAM POSITIVE COCCI IN PAIRS FEW GRAM NEGATIVE COCCOBACILLI FEW GRAM POSITIVE RODS    Culture PENDING  Incomplete   Report Status PENDING  Incomplete    Radiology Reports Ct Soft Tissue Neck W Contrast  Result Date: 08/17/2016 CLINICAL DATA:  Recent dental procedure. Right jaw and neck swelling with pain. Right mandibular molar pain soft tissue swelling. Rule out Ludwig angina. EXAM: CT NECK WITH CONTRAST TECHNIQUE: Multidetector CT imaging of the neck was performed using the standard protocol following the bolus administration of intravenous contrast. CONTRAST:  75mL ISOVUE-300 IOPAMIDOL (ISOVUE-300) INJECTION 61% COMPARISON:  None. FINDINGS: Pharynx and larynx: Mild hypertrophy of the tonsils bilaterally. No peritonsillar abscess. No pharyngeal mass. Normal airway. Larynx normal. Salivary glands: No inflammation, mass, or stone. Thyroid: Negative Lymph nodes: Mild reactive adenopathy in the neck bilaterally, due to infection Vascular:  Arterial and venous patency is present Limited intracranial: Negative Visualized orbits: Negative Mastoids and visualized paranasal sinuses: Negative Skeleton: Caries right lower molar with minimal periapical lucency. No bony destruction. Caries also present left upper molar. No apparent apical abscess. Negative for osteomyelitis of the mandible. Upper chest: Negative Other: Soft tissue swelling in the floor of the mouth on the right. The mylohyoid is enlarged on the right. There is a small rim enhancing fluid collection in the posterior right sub lingual space which may represent an abscess measuring approximately 5 x 15 mm. In addition, there is an enhancing submental lymph node measuring under 1 cm with mild stranding of the platysmas muscle and subcutaneous tissue in the right submandibular region. IMPRESSION: Soft tissue swelling floor of mouth on the right with enlargement of the right mylohyoid muscle. 5 x 15 mm rim enhancing fluid collection right posterior  sublingual space consistent with small abscess. Findings compatible with floor of the mouth infection. These results were called by telephone at the time of interpretation on 08/17/2016 at 3:14 pm to Dr. Rolland PorterMARK JAMES , who verbally acknowledged these results. Electronically Signed   By: Marlan Palauharles  Clark M.D.   On: 08/17/2016 15:15     CBC  Recent Labs Lab 08/17/16 1443 08/18/16 0359 08/19/16 0447  WBC 11.5* 8.7 12.4*  HGB 12.4 10.6* 10.7*  HCT 35.5* 32.2* 33.1*  PLT 279 275 305  MCV 87.7 87.3 89.9  MCH 30.6 28.7 29.1  MCHC 34.9 32.9 32.3  RDW 11.6 11.8 12.4  LYMPHSABS 1.0 0.9  --   MONOABS 0.8 0.8  --   EOSABS 0.0 0.0  --   BASOSABS 0.0 0.0  --     Chemistries   Recent Labs Lab 08/17/16 1443 08/18/16 0359 08/19/16 0447  NA 135 133* 136  K 3.3* 4.1 4.0  CL 98* 103 105  CO2 27  23 25  GLUCOSE 93 123* 103*  BUN 12 9 10   CREATININE 0.75 0.72 0.80  CALCIUM 9.6 8.9 8.6*  MG  --  1.6*  --   AST  --   --  18  ALT  --   --  11*  ALKPHOS  --   --  48  BILITOT  --   --  0.6   ------------------------------------------------------------------------------------------------------------------ estimated creatinine clearance is 104.3 mL/min (by C-G formula based on SCr of 0.8 mg/dL). ------------------------------------------------------------------------------------------------------------------ No results for input(s): HGBA1C in the last 72 hours. ------------------------------------------------------------------------------------------------------------------ No results for input(s): CHOL, HDL, LDLCALC, TRIG, CHOLHDL, LDLDIRECT in the last 72 hours. ------------------------------------------------------------------------------------------------------------------ No results for input(s): TSH, T4TOTAL, T3FREE, THYROIDAB in the last 72 hours.  Invalid input(s): FREET3 ------------------------------------------------------------------------------------------------------------------ No  results for input(s): VITAMINB12, FOLATE, FERRITIN, TIBC, IRON, RETICCTPCT in the last 72 hours.  Coagulation profile No results for input(s): INR, PROTIME in the last 168 hours.  No results for input(s): DDIMER in the last 72 hours.  Cardiac Enzymes No results for input(s): CKMB, TROPONINI, MYOGLOBIN in the last 168 hours.  Invalid input(s): CK ------------------------------------------------------------------------------------------------------------------ Invalid input(s): POCBNP   CBG: No results for input(s): GLUCAP in the last 168 hours.     Studies: Ct Soft Tissue Neck W Contrast  Result Date: 08/17/2016 CLINICAL DATA:  Recent dental procedure. Right jaw and neck swelling with pain. Right mandibular molar pain soft tissue swelling. Rule out Ludwig angina. EXAM: CT NECK WITH CONTRAST TECHNIQUE: Multidetector CT imaging of the neck was performed using the standard protocol following the bolus administration of intravenous contrast. CONTRAST:  75mL ISOVUE-300 IOPAMIDOL (ISOVUE-300) INJECTION 61% COMPARISON:  None. FINDINGS: Pharynx and larynx: Mild hypertrophy of the tonsils bilaterally. No peritonsillar abscess. No pharyngeal mass. Normal airway. Larynx normal. Salivary glands: No inflammation, mass, or stone. Thyroid: Negative Lymph nodes: Mild reactive adenopathy in the neck bilaterally, due to infection Vascular:  Arterial and venous patency is present Limited intracranial: Negative Visualized orbits: Negative Mastoids and visualized paranasal sinuses: Negative Skeleton: Caries right lower molar with minimal periapical lucency. No bony destruction. Caries also present left upper molar. No apparent apical abscess. Negative for osteomyelitis of the mandible. Upper chest: Negative Other: Soft tissue swelling in the floor of the mouth on the right. The mylohyoid is enlarged on the right. There is a small rim enhancing fluid collection in the posterior right sub lingual space which may  represent an abscess measuring approximately 5 x 15 mm. In addition, there is an enhancing submental lymph node measuring under 1 cm with mild stranding of the platysmas muscle and subcutaneous tissue in the right submandibular region. IMPRESSION: Soft tissue swelling floor of mouth on the right with enlargement of the right mylohyoid muscle. 5 x 15 mm rim enhancing fluid collection right posterior sublingual space consistent with small abscess. Findings compatible with floor of the mouth infection. These results were called by telephone at the time of interpretation on 08/17/2016 at 3:14 pm to Dr. Rolland Porter , who verbally acknowledged these results. Electronically Signed   By: Marlan Palau M.D.   On: 08/17/2016 15:15      No results found for: HGBA1C Lab Results  Component Value Date   CREATININE 0.80 08/19/2016       Scheduled Meds: Continuous Infusions: . ampicillin-sulbactam (UNASYN) IV 3 g (08/19/16 0828)     LOS: 1 day    Time spent: >30 MINS    Richarda Overlie  [facility] Pager [phone]. If 7PM-7AM, please contact night-coverage at  [email or web address], password [facility] 08/19/2016, 8:37 AM  LOS: 1 day

## 2016-08-20 LAB — AEROBIC/ANAEROBIC CULTURE (SURGICAL/DEEP WOUND)

## 2016-08-20 LAB — AEROBIC/ANAEROBIC CULTURE W GRAM STAIN (SURGICAL/DEEP WOUND)

## 2016-08-20 MED ORDER — AMOXICILLIN-POT CLAVULANATE 875-125 MG PO TABS: 1.0000 | ORAL_TABLET | Freq: Two times a day (BID) | ORAL | 0 refills | Status: AC

## 2016-08-20 MED ORDER — BIOTENE DRY MOUTH MT LIQD: 15.0000 mL | OROMUCOSAL | 0 refills | Status: AC | PRN

## 2016-08-20 MED ORDER — MAGIC MOUTHWASH W/LIDOCAINE: 5.0000 mL | Freq: Three times a day (TID) | ORAL | 0 refills | Status: AC | PRN

## 2016-08-20 NOTE — Progress Notes (Signed)
Pt given tylenol for headache at 1254, pt discharged at 1304 via wheelchair with belongings with friend/family, escorted by unit NT. Pt instructed to go over to Dr Claudine MoutonScott Jenson's office at this time to have penrose drain removed. Also encouraged pt to make sure she takes all of her antibiotics to clear up infection, states she will.

## 2016-08-20 NOTE — Care Management Note (Signed)
Case Management Note  Patient Details  Name: Claudia Blackwell MRN: [id number] Date of Birth: [birth year]  Subjective/Objective:                    Action/Plan:   Expected Discharge Date:  08/20/16               Expected Discharge Plan:  Home/Self Care  In-House Referral:     Discharge planning Services  CM Consult, Medication Assistance, [facility]  Post Acute Care Choice:    Choice offered to:  Patient  DME Arranged:    DME Agency:     HH Arranged:    HH Agency:     Status of Service:  Completed, signed off  If discussed at [employer] Length [employer] Stay Meetings, dates discussed:    Additional Comments:  Kingsley PlanWile, Magdelyn Roebuck Marie, RN 08/20/2016, 11:25 AM

## 2016-08-20 NOTE — Progress Notes (Signed)
D/c instructions reviewed with pt. Copy of instructions and 1 script given to pt, others sent in electronically to pt's pharmacy. Pt wanting to eat a few bites of her food and take some tylenol for her headache.

## 2016-08-20 NOTE — Discharge Summary (Signed)
Physician Discharge Summary  Claudia Blackwell MRN: [id number] DOB/AGE: Aug 17, 1995 21 y.o.  PCP: Patient, No Pcp Per   Admit date: 08/17/2016 Discharge date: 08/20/2016  Discharge Diagnoses:    Principal Problem:   Oral abscess Active Problems:   Hypokalemia   Ludwig's angina syndrome    Follow-up recommendations Follow-up with PCP in 3-5 days , including all  additional recommended appointments as below Follow-up CBC, CMP in 3-5 days Patient to follow-up with Diona Browner, DDS      Current Discharge Medication List    START taking these medications   Details  amoxicillin-clavulanate (AUGMENTIN) 875-125 MG tablet Take 1 tablet by mouth 2 (two) times daily. Qty: 28 tablet, Refills: 0    antiseptic oral rinse (BIOTENE) LIQD 15 mLs by Mouth Rinse route as needed for dry mouth. Qty: 237 mL, Refills: 0    magic mouthwash w/lidocaine SOLN Take 5 mLs by mouth 3 (three) times daily as needed for mouth pain. Qty: 250 mL, Refills: 0      CONTINUE these medications which have NOT CHANGED   Details  HYDROcodone-acetaminophen (NORCO/VICODIN) 5-325 MG tablet Take 1-2 tablets by mouth every 6 hours as needed for pain. Qty: 15 tablet, Refills: 0      STOP taking these medications     amoxicillin (AMOXIL) 500 MG capsule          Discharge Condition: *Stable  Discharge Instructions Get Medicines reviewed and adjusted: Please take all your medications with you for your next visit with your Primary MD  Please request your Primary MD to go over all hospital tests and procedure/radiological results at the follow up, please ask your Primary MD to get all Hospital records sent to his/her office.  If you experience worsening of your admission symptoms, develop shortness of breath, life threatening emergency, suicidal or homicidal thoughts you must seek medical attention immediately by calling 911 or calling your MD immediately if symptoms less severe.  You must read  complete instructions/literature along with all the possible adverse reactions/side effects for all the Medicines you take and that have been prescribed to you. Take any new Medicines after you have completely understood and accpet all the possible adverse reactions/side effects.   Do not drive when taking Pain medications.   Do not take more than prescribed Pain, Sleep and Anxiety Medications  Special Instructions: If you have smoked or chewed Tobacco in the last 2 yrs please stop smoking, stop any regular Alcohol and or any Recreational drug use.  Wear Seat belts while driving.  Please note  You were cared for by a hospitalist during your hospital stay. Once you are discharged, your primary care physician will handle any further medical issues. Please note that NO REFILLS for any discharge medications will be authorized once you are discharged, as it is imperative that you return to your primary care physician (or establish a relationship with a primary care physician if you do not have one) for your aftercare needs so that they can reassess your need for medications and monitor your lab values.     No Known Allergies    Disposition: 01-Home or Self Care   Consults:  Oral surgery    Significant Diagnostic Studies:  Ct Soft Tissue Neck W Contrast  Result Date: 08/17/2016 CLINICAL DATA:  Recent dental procedure. Right jaw and neck swelling with pain. Right mandibular molar pain soft tissue swelling. Rule out Ludwig angina. EXAM: CT NECK WITH CONTRAST TECHNIQUE: Multidetector CT imaging of the neck was performed  using the standard protocol following the bolus administration of intravenous contrast. CONTRAST:  67m ISOVUE-300 IOPAMIDOL (ISOVUE-300) INJECTION 61% COMPARISON:  None. FINDINGS: Pharynx and larynx: Mild hypertrophy of the tonsils bilaterally. No peritonsillar abscess. No pharyngeal mass. Normal airway. Larynx normal. Salivary glands: No inflammation, mass, or stone.  Thyroid: Negative Lymph nodes: Mild reactive adenopathy in the neck bilaterally, due to infection Vascular:  Arterial and venous patency is present Limited intracranial: Negative Visualized orbits: Negative Mastoids and visualized paranasal sinuses: Negative Skeleton: Caries right lower molar with minimal periapical lucency. No bony destruction. Caries also present left upper molar. No apparent apical abscess. Negative for osteomyelitis of the mandible. Upper chest: Negative Other: Soft tissue swelling in the floor of the mouth on the right. The mylohyoid is enlarged on the right. There is a small rim enhancing fluid collection in the posterior right sub lingual space which may represent an abscess measuring approximately 5 x 15 mm. In addition, there is an enhancing submental lymph node measuring under 1 cm with mild stranding of the platysmas muscle and subcutaneous tissue in the right submandibular region. IMPRESSION: Soft tissue swelling floor of mouth on the right with enlargement of the right mylohyoid muscle. 5 x 15 mm rim enhancing fluid collection right posterior sublingual space consistent with small abscess. Findings compatible with floor of the mouth infection. These results were called by telephone at the time of interpretation on 08/17/2016 at 3:14 pm to Dr. MTanna Furry, who verbally acknowledged these results. Electronically Signed   By: CFranchot GalloM.D.   On: 08/17/2016 15:15      Filed Weights   08/17/16 1331  Weight: 59.9 kg (132 lb)     Microbiology: Recent Results (from the past 240 hour(s))  Aerobic/Anaerobic Culture (surgical/deep wound)     Status: None (Preliminary result)   Collection Time: 08/18/16 11:41 AM  Result Value Ref Range Status   Specimen Description ABSCESS  Final   Special Requests   Final    RIGHT SUBLINGUAL INFECTION PATIENT ON FOLLOWING UNASYN   Gram Stain   Final    MODERATE WBC PRESENT, PREDOMINANTLY PMN MODERATE GRAM POSITIVE COCCI IN PAIRS FEW GRAM  NEGATIVE COCCOBACILLI FEW GRAM POSITIVE RODS    Culture CULTURE REINCUBATED FOR BETTER GROWTH  Final   Report Status PENDING  Incomplete       Blood Culture    Component Value Date/Time   SDES ABSCESS 08/18/2016 1141   SPECREQUEST  08/18/2016 1141    RIGHT SUBLINGUAL INFECTION PATIENT ON FOLLOWING UNASYN   CULT CULTURE REINCUBATED FOR BETTER GROWTH 08/18/2016 1141   REPTSTATUS PENDING 08/18/2016 1141      Labs: Results for orders placed or performed during the hospital encounter of 08/17/16 (from the past 48 hour(s))  I-Stat beta hCG blood, ED     Status: None   Collection Time: 08/18/16 10:44 AM  Result Value Ref Range   I-stat hCG, quantitative <5.0 <5 mIU/mL   Comment 3            Comment:   GEST. AGE      CONC.  (mIU/mL)   <=1 WEEK        5 - 50     2 WEEKS       50 - 500     3 WEEKS       100 - 10,000     4 WEEKS     1,000 - 30,000        FEMALE AND NON-PREGNANT FEMALE:  LESS THAN 5 mIU/mL   Aerobic/Anaerobic Culture (surgical/deep wound)     Status: None (Preliminary result)   Collection Time: 08/18/16 11:41 AM  Result Value Ref Range   Specimen Description ABSCESS    Special Requests      RIGHT SUBLINGUAL INFECTION PATIENT ON FOLLOWING UNASYN   Gram Stain      MODERATE WBC PRESENT, PREDOMINANTLY PMN MODERATE GRAM POSITIVE COCCI IN PAIRS FEW GRAM NEGATIVE COCCOBACILLI FEW GRAM POSITIVE RODS    Culture CULTURE REINCUBATED FOR BETTER GROWTH    Report Status PENDING   Comprehensive metabolic panel     Status: Abnormal   Collection Time: 08/19/16  4:47 AM  Result Value Ref Range   Sodium 136 135 - 145 mmol/L   Potassium 4.0 3.5 - 5.1 mmol/L   Chloride 105 101 - 111 mmol/L   CO2 25 22 - 32 mmol/L   Glucose, Bld 103 (H) 65 - 99 mg/dL   BUN 10 6 - 20 mg/dL   Creatinine, Ser 0.80 0.44 - 1.00 mg/dL   Calcium 8.6 (L) 8.9 - 10.3 mg/dL   Total Protein 7.1 6.5 - 8.1 g/dL   Albumin 3.1 (L) 3.5 - 5.0 g/dL   AST 18 15 - 41 U/L   ALT 11 (L) 14 - 54 U/L    Alkaline Phosphatase 48 38 - 126 U/L   Total Bilirubin 0.6 0.3 - 1.2 mg/dL   GFR calc non Af Amer >60 >60 mL/min   GFR calc Af Amer >60 >60 mL/min    Comment: (NOTE) The eGFR has been calculated using the CKD EPI equation. This calculation has not been validated in all clinical situations. eGFR's persistently <60 mL/min signify possible Chronic Kidney Disease.    Anion gap 6 5 - 15  CBC     Status: Abnormal   Collection Time: 08/19/16  4:47 AM  Result Value Ref Range   WBC 12.4 (H) 4.0 - 10.5 K/uL   RBC 3.68 (L) 3.87 - 5.11 MIL/uL   Hemoglobin 10.7 (L) 12.0 - 15.0 g/dL   HCT 33.1 (L) 36.0 - 46.0 %   MCV 89.9 78.0 - 100.0 fL   MCH 29.1 26.0 - 34.0 pg   MCHC 32.3 30.0 - 36.0 g/dL   RDW 12.4 11.5 - 15.5 %   Platelets 305 150 - 400 K/uL       HPI :  21 y.o. female with medical history significant for asthma in childhood, now presenting in transfer from [place] where she presented with pain and swelling at her right mandibular premolar region and under her chin. She also reports fevers for the past 2 nights. Patient states that she had been in her usual state of health until approximately 08/13/2016 when she noted severe pain and swelling at the lower right jaw and under her chin. She was evaluated in the emergency department the following day, given a dental block and discharged with amoxicillin and narcotic analgesia. Patient reports that she has been taking these medications as directed, but the pain and swelling has continued to worsen. CT of the neck soft tissues reveal soft tissue swelling at the floor the mouth on the right side with enlarged right mild hyaloid muscle and rim-enhancing fluid collection at the right posterior sublingual space consistent with a small abscess. She was treated with IV clindamycin, Decadron 10 mg IV, morphine, Zofran, and 500 mL of normal saline in the ED. ED physician consulted with oral surgery who advised a medical admission over  to  [facility] COURSE:    1. Right sublingual space infection, dental abscess - Pt presents with pain and swelling at right anterior mandible,    - She has been taking amoxicillin and Norco at home since 08/14/16 but reports worsening in sxs  - Leukocytosis improved 11.5>8.7, CT scan shows soft tissue edema in floor or mouth on right with enlarged right myohyloid muscle and small abscess in right sublingual space  -Patient   evaluated by oral surgery Dr. Hoyt Koch, status post incision and drainage of the right sublingual space infection Patient treated with IV Unasyn, day #3, Penrose drain removed, transitioned to oral Augmentin for 2 weeks  magic mouth wash with lidocaine for dental pain    2. Hypokalemia Repleted  Discharge Exam:   Blood pressure 108/67, pulse 69, temperature 98.3 F (36.8 C), temperature source Axillary, resp. rate 16, height 5' 7"  (1.702 m), weight 59.9 kg (132 lb), last menstrual period 08/12/2016, SpO2 100 %.  General exam:  Mild tenderness in the right submandibular area, Penrose drain removed   Respiratory system: Clear to auscultation. Respiratory effort normal. Cardiovascular system: S1 & S2 heard, RRR. No JVD, murmurs, rubs, gallops or clicks. No pedal edema. Gastrointestinal system: Abdomen is nondistended, soft and nontender. No organomegaly or masses felt. Normal bowel sounds heard. Central nervous system: Alert and oriented. No focal neurological deficits. Extremities: Symmetric 5 x 5 power.    Follow-up Information    Diona Browner, DDS Follow up.   Specialty:  Oral Surgery Why:  08/20/16 2pm to have drain removed Contact information: [place] [address] [phone]        PCP. Call.   Why:  Follow-up in 3-5 days          Signed: Reyne Dumas 08/20/2016, 8:46 AM        Time spent >1 hour

## 2018-11-25 IMAGING — CT CT NECK W/ CM
4 of 5 series · 14 of 33 positions shown, 16 images · IV contrast (iopamidol)
Comparison: None.

CLINICAL DATA: Recent dental procedure. Right jaw and neck swelling
with pain. Right mandibular molar pain soft tissue swelling. Rule
out Saqoor angina.

EXAM:
CT NECK WITH CONTRAST
TECHNIQUE: Multidetector CT imaging of the neck was performed using the
standard protocol following the bolus administration of intravenous
contrast.
CONTRAST:  75mL 5SOP9K-0MM IOPAMIDOL (5SOP9K-0MM) INJECTION 61%

[Series 3: axial neck · axial · 0.48mm/px · z∈[-265,-121]mm · 4 of 122 slices shown, 5 images]
[im 25/122  soft-tissue]
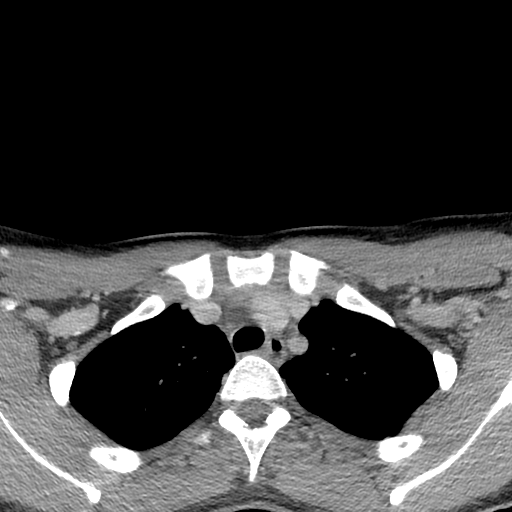
[im 25/122  bone]
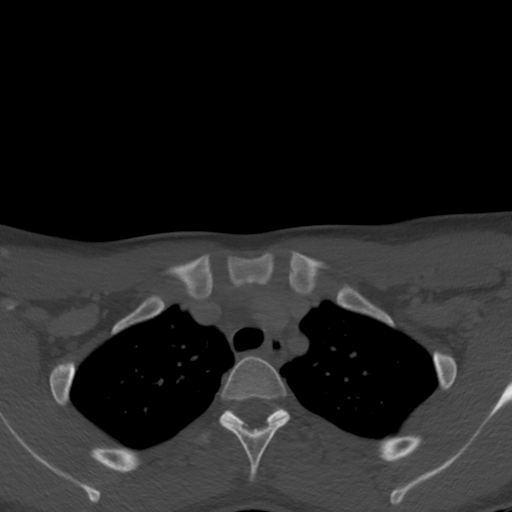
[im 49/122  bone]
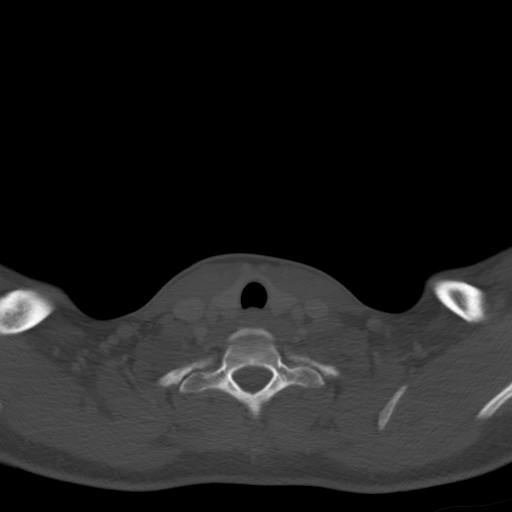
[im 73/122  bone]
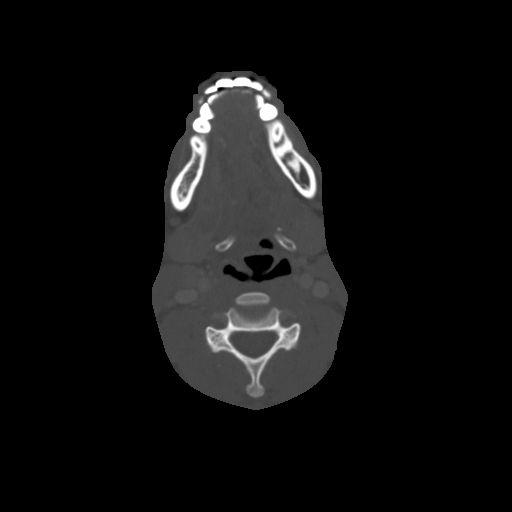
[im 97/122  bone]
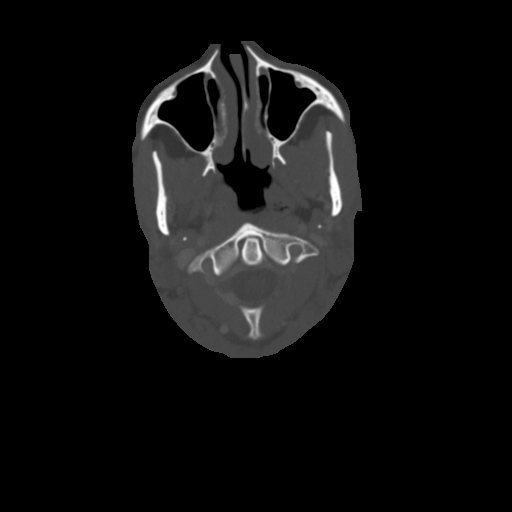

[Series 4: sag neck · sagittal · 0.39mm/px · 5 of 95 slices shown, 6 images]
[im 32/95  bone]
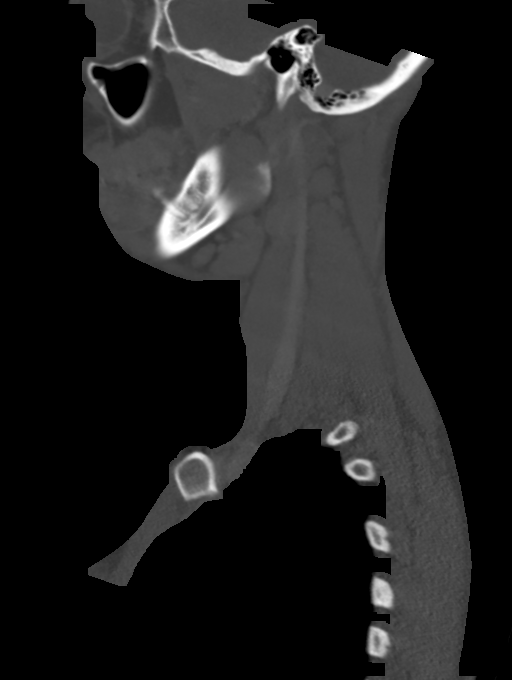
[im 40/95  bone]
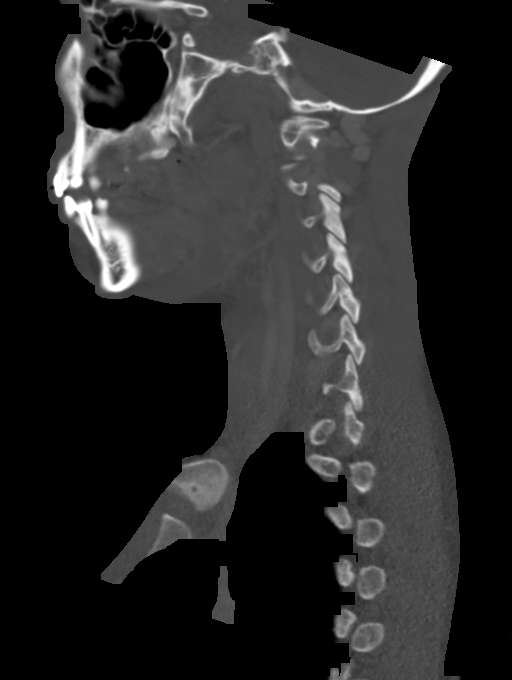
[im 48/95  soft-tissue]
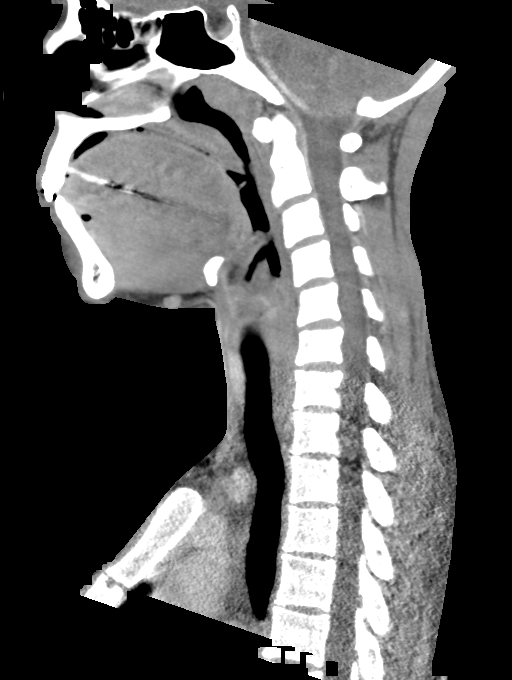
[im 48/95  bone]
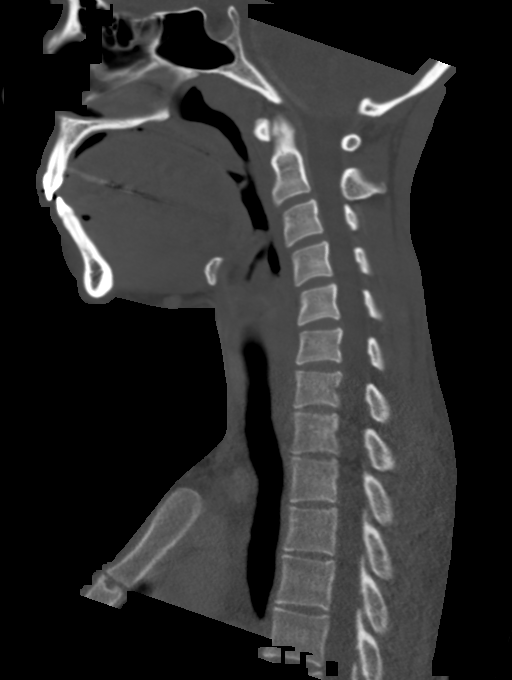
[im 55/95  bone]
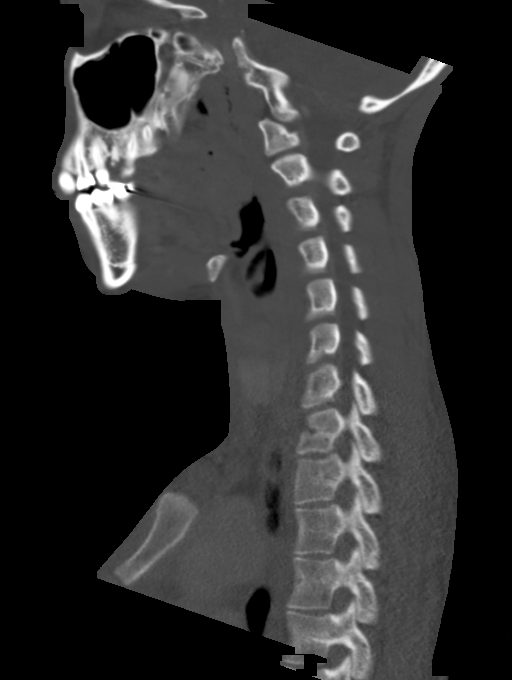
[im 63/95  bone]
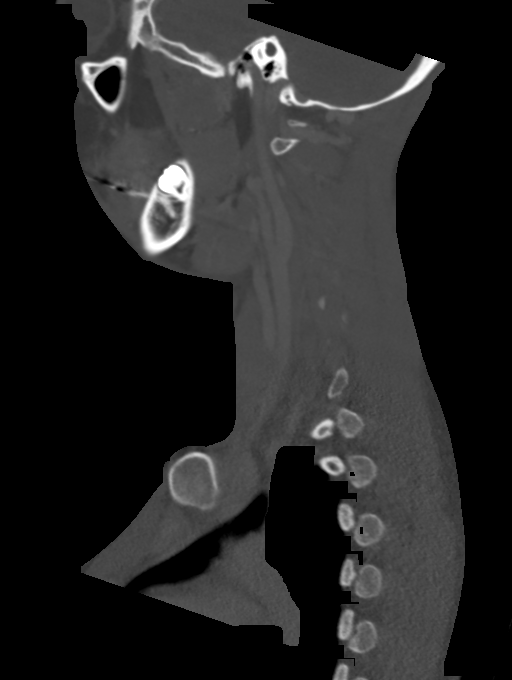

[Series 5: cor neck · coronal · 0.37mm/px · 3 of 91 slices shown]
[im 27/91  bone]
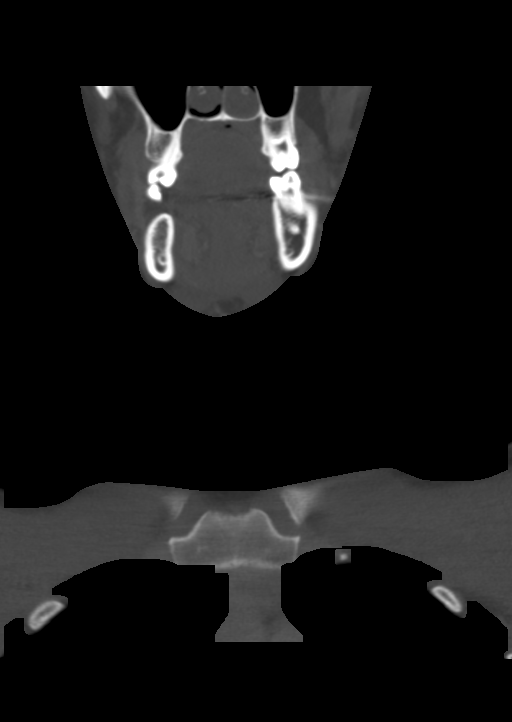
[im 39/91  bone]
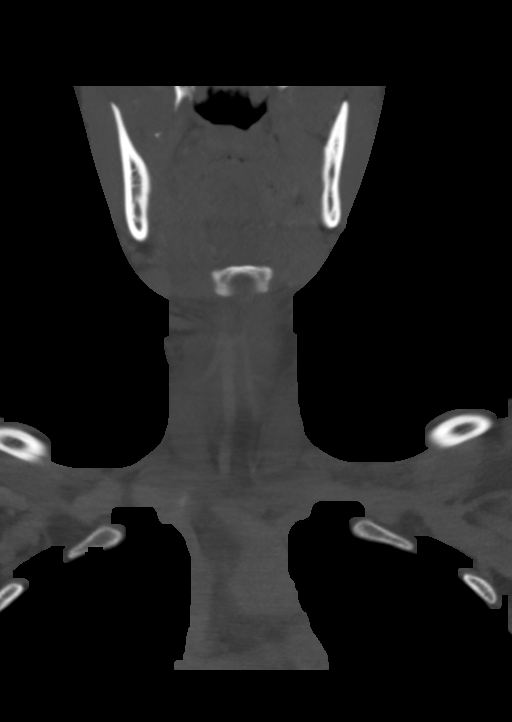
[im 52/91  bone]
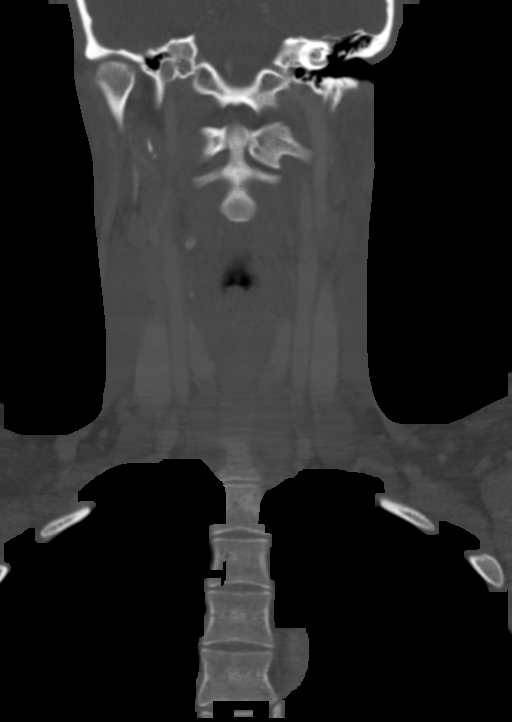

[Series 6: orthogonal ax · axial · 0.39mm/px · z∈[-299,-251]mm · 2 of 131 slices shown]
[im 27/131  bone]
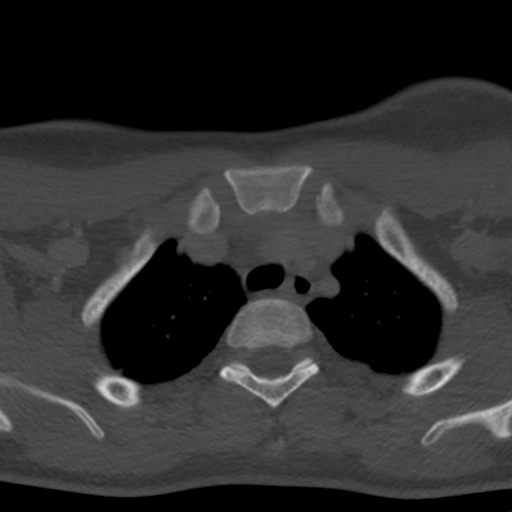
[im 53/131  bone]
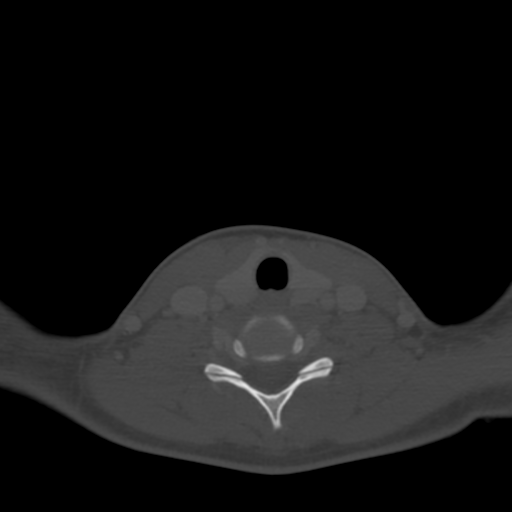

[14 of 33 positions shown; findings below may reference images not displayed]

FINDINGS: Pharynx and larynx: Mild hypertrophy of the tonsils bilaterally. No
peritonsillar abscess. No pharyngeal mass. Normal airway. Larynx
normal.

Salivary glands: No inflammation, mass, or stone.

Thyroid: Negative

Lymph nodes: Mild reactive adenopathy in the neck bilaterally, due
to infection

Vascular:  Arterial and venous patency is present

Limited intracranial: Negative

Visualized orbits: Negative

Mastoids and visualized paranasal sinuses: Negative

Skeleton: Caries right lower molar with minimal periapical lucency.
No bony destruction. Caries also present left upper molar. No
apparent apical abscess. Negative for osteomyelitis of the mandible.

Upper chest: Negative

Other: Soft tissue swelling in the floor of the mouth on the right.
The mylohyoid is enlarged on the right. There is a small rim
enhancing fluid collection in the posterior right sub lingual space
which may represent an abscess measuring approximately 5 x 15 mm. In
addition, there is an enhancing submental lymph node measuring under
1 cm with mild stranding of the platysmas muscle and subcutaneous
tissue in the right submandibular region.
IMPRESSION: Soft tissue swelling floor of mouth on the right with enlargement of
the right mylohyoid muscle. 5 x 15 mm rim enhancing fluid collection
right posterior sublingual space consistent with small abscess.
Findings compatible with floor of the mouth infection.

These results were called by telephone at the time of interpretation
on 08/17/2016 at [DATE] to Dr. MARZEPHINE TABALUYAN , who verbally
acknowledged these results.

## 2023-10-13 ENCOUNTER — Emergency Department (HOSPITAL_COMMUNITY)
Admission: EM | Admit: 2023-10-13 | Discharge: 2023-10-13 | Disposition: A | Discharge status: Home / Self Care | Payer: Self-pay | Attending: Emergency Medicine | Admitting: Emergency Medicine

## 2023-10-13 ENCOUNTER — Encounter (HOSPITAL_COMMUNITY): Payer: Self-pay

## 2023-10-13 ENCOUNTER — Emergency Department (HOSPITAL_COMMUNITY): Payer: Self-pay

## 2023-10-13 DIAGNOSIS — G43009 Migraine without aura, not intractable, without status migrainosus: Secondary | ICD-10-CM

## 2023-10-13 LAB — CBC
HCT: 38.2 % (ref 36.0–46.0)
Hemoglobin: 12.7 g/dL (ref 12.0–15.0)
MCH: 30.7 pg (ref 26.0–34.0)
MCHC: 33.2 g/dL (ref 30.0–36.0)
MCV: 92.3 fL (ref 80.0–100.0)
Platelets: 265 K/uL (ref 150–400)
RBC: 4.14 MIL/uL (ref 3.87–5.11)
RDW: 12.2 % (ref 11.5–15.5)
WBC: 8.9 K/uL (ref 4.0–10.5)
nRBC: 0 % (ref 0.0–0.2)

## 2023-10-13 LAB — BASIC METABOLIC PANEL WITH GFR
Anion gap: 12 (ref 5–15)
BUN: 16 mg/dL (ref 6–20)
CO2: 23 mmol/L (ref 22–32)
Calcium: 9.6 mg/dL (ref 8.9–10.3)
Chloride: 103 mmol/L (ref 98–111)
Creatinine, Ser: 0.96 mg/dL (ref 0.44–1.00)
GFR, Estimated: >60 mL/min (ref >60)
Glucose, Bld: 96 mg/dL (ref 70–99)
Potassium: 3.7 mmol/L (ref 3.5–5.1)
Sodium: 138 mmol/L (ref 135–145)

## 2023-10-13 LAB — HCG, SERUM, QUALITATIVE: Preg, Serum: NEGATIVE

## 2023-10-13 MED ORDER — AMOXICILLIN-POT CLAVULANATE 875-125 MG PO TABS: 1.0000 | ORAL_TABLET | Freq: Two times a day (BID) | ORAL | 0 refills | Status: AC

## 2023-10-13 MED ORDER — DIPHENHYDRAMINE HCL 50 MG/ML IJ SOLN
12.5000 mg | Freq: Once | INTRAMUSCULAR | Status: AC
  Administered 2023-10-13 (×2): 12.5 mg via INTRAVENOUS
  Filled 2023-10-13: qty 1

## 2023-10-13 MED ORDER — ACETAMINOPHEN 500 MG PO TABS
1000.0000 mg | ORAL_TABLET | Freq: Once | ORAL | Status: AC
  Administered 2023-10-13 (×2): 1000 mg via ORAL
  Filled 2023-10-13: qty 2

## 2023-10-13 MED ORDER — AMOXICILLIN-POT CLAVULANATE 875-125 MG PO TABS
1.0000 | ORAL_TABLET | Freq: Once | ORAL | Status: AC
  Administered 2023-10-13 (×2): 1 via ORAL
  Filled 2023-10-13: qty 1

## 2023-10-13 MED ORDER — LACTATED RINGERS IV BOLUS
1000.0000 mL | Freq: Once | INTRAVENOUS | Status: AC
  Administered 2023-10-13 (×2): 1000 mL via INTRAVENOUS

## 2023-10-13 MED ORDER — KETOROLAC TROMETHAMINE 15 MG/ML IJ SOLN
15.0000 mg | Freq: Once | INTRAMUSCULAR | Status: AC
  Administered 2023-10-13 (×2): 15 mg via INTRAVENOUS
  Filled 2023-10-13: qty 1

## 2023-10-13 MED ORDER — PROCHLORPERAZINE EDISYLATE 10 MG/2ML IJ SOLN
10.0000 mg | Freq: Once | INTRAMUSCULAR | Status: AC
  Administered 2023-10-13 (×2): 10 mg via INTRAVENOUS
  Filled 2023-10-13: qty 2

## 2023-10-13 NOTE — ED Triage Notes (Signed)
 Pt arrived via POV, c/o headache. Light and sound sensitivity. Pain throughout head, eye, and neck. Hx of same, was told possibly stress induced but no improvement. Has been having headaches similar to this on and off all year.

## 2023-10-13 NOTE — Discharge Instructions (Signed)
 You were seen today for your headache No specific cause was found today for your headache. It may have been a migraine or other cause of headache. Stress, anxiety, fatigue, weather changes are common triggers for headaches.   You may take up to 1000mg  of tylenol  every 6 hours as needed for headache. Do not take more then 4g per day.   You may use up to 600mg  ibuprofen every 6 hours as needed for headache.  Do not exceed 2.4g of ibuprofen per day.  It also appears you may have a dental infection.  You have been prescribed an antibiotic called Augmentin  to treat this.  Please take this twice daily for the next 7 days as prescribed.  Please take your full course of antibiotics even if you start feeling better.  Please follow-up with a dentist as soon as possible for further dental care.   Tests performed today include: CT of your head which was normal and did not show any serious cause of your headache  Your blood counts, electrolytes, kidney, and liver function were normal today. Pregnancy test was negative   Follow-up instructions: Please follow-up with your primary care provider if your headaches are not being controlled with over-the-counter medications like Tylenol  and ibuprofen or becoming more frequent.  Return instructions:  Please return to the Emergency Department if you experience worsening symptoms. Return if the medications do not resolve your headache, if it recurs, or if you have multiple episodes of vomiting or cannot keep down fluids. Return if you have a change from the usual headache. RETURN IMMEDIATELY IF you: Develop a sudden, severe headache Develop confusion or become poorly responsive or faint Develop a fever above 100.59F or problem breathing Have a change in speech, vision, swallowing, or understanding Develop new weakness, numbness, tingling, incoordination in your arms or legs Have a seizure Have lots of pain in your neck Please return if you have any other  emergent concerns.

## 2023-10-13 NOTE — ED Provider Notes (Signed)
 [place] [facility]GENCY DEPARTMENT AT [facility] Provider Note   CSN: [id number] Arrival date & time: 10/13/23  1112     Patient presents with: Headache   Claudia Blackwell is a 28 y.o. female with history of headaches, presents with concern for a headache that started yesterday.  This was gradual onset.  Began as a throbbing pain behind her forehead.  She has developed increased pain and light and sound sensitivity today.  Also reports an episode of nonbloody, nonbilious emesis this morning.  Denies any neck pain or stiffness.  Denies any fever or chills.  Denies any head trauma.  Reports she used to have very frequent headaches in 2023 and then they seem to improve.  This year, she has been having more frequent headaches again.  Also reports some pain to her left upper jaw that started a couple weeks ago.  Has not seen a dentist about it yet.    Headache      Prior to Admission medications   Medication Sig Start Date End Date Taking? Authorizing Provider  amoxicillin -clavulanate (AUGMENTIN ) 875-125 MG tablet Take 1 tablet by mouth 2 (two) times daily for 7 days. 10/13/23 10/20/23 Yes Veta Palma, PA-C  antiseptic oral rinse (BIOTENE) LIQD 15 mLs by Mouth Rinse route as needed for dry mouth. 08/20/16   Abrol, Nayana, MD  HYDROcodone -acetaminophen  (NORCO/VICODIN) 5-325 MG tablet Take 1-2 tablets by mouth every 6 hours as needed for pain. 08/14/16   Pisciotta, Nat, PA-C  magic mouthwash w/lidocaine  SOLN Take 5 mLs by mouth 3 (three) times daily as needed for mouth pain. 08/20/16   Abrol, Nayana, MD    Allergies: Patient has no known allergies.    Review of Systems  Neurological:  Positive for headaches.    Updated Vital Signs BP 116/76 (BP Location: Left Arm)   Pulse 60   Temp 98.2 F (36.8 C) (Oral)   Resp 17   LMP 09/16/2023   SpO2 100%   Physical Exam Vitals and nursing note reviewed.  Constitutional:      General: She is not in acute distress.     Appearance: She is well-developed.     Comments: Wearing sunglasses in the room  HENT:     Head: Normocephalic and atraumatic.     Mouth/Throat:     Comments: Erythema and slight edema of the upper left gumline where there is a missing tooth.  No abscess noted. Eyes:     Extraocular Movements: Extraocular movements intact.     Conjunctiva/sclera: Conjunctivae normal.     Pupils: Pupils are equal, round, and reactive to light.  Neck:     Comments: Moves neck without difficulty Cardiovascular:     Rate and Rhythm: Normal rate and regular rhythm.     Heart sounds: No murmur heard. Pulmonary:     Effort: Pulmonary effort is normal. No respiratory distress.     Breath sounds: Normal breath sounds.  Abdominal:     Palpations: Abdomen is soft.     Tenderness: There is no abdominal tenderness.  Musculoskeletal:        General: No swelling.     Cervical back: Neck supple. No rigidity.  Skin:    General: Skin is warm and dry.     Capillary Refill: Capillary refill takes less than 2 seconds.  Neurological:     General: No focal deficit present.     Mental Status: She is alert.  Psychiatric:        Mood and Affect:  Mood normal.     (all labs ordered are listed, but only abnormal results are displayed) Labs Reviewed  CBC  BASIC METABOLIC PANEL WITH GFR  HCG, SERUM, QUALITATIVE    EKG: None  Radiology: CT Head Wo Contrast Result Date: 10/13/2023 CLINICAL DATA:  Headache, increasing frequency or severity EXAM: CT HEAD WITHOUT CONTRAST TECHNIQUE: Contiguous axial images were obtained from the base of the skull through the vertex without intravenous contrast. RADIATION DOSE REDUCTION: This exam was performed according to the departmental dose-optimization program which includes automated exposure control, adjustment of the mA and/or kV according to patient size and/or use of iterative reconstruction technique. COMPARISON:  August 17, 2016 FINDINGS: Brain: The ventricles appear age  appropriate. No mass effect or midline shift. Gray-white differentiation is preserved without focal attenuation abnormality.No evidence of acute territorial infarction, extra-axial fluid collection, hemorrhage, or mass lesion. The basilar cisterns are patent without downward herniation. The cerebellar hemispheres and vermis are well formed without mass lesion or focal attenuation abnormality. Vascular: No hyperdense vessel. Skull: Normal. Negative for fracture or focal lesion. Sinuses/Orbits: The paranasal sinuses and mastoids are clear.The globes appear intact. No retrobulbar hematoma. Other: None. IMPRESSION: No acute intracranial abnormality, specifically, no acute hemorrhage, territorial infarction, or intracranial mass. Electronically Signed   By: Rogelia Myers M.D.   On: 10/13/2023 16:25     Procedures   Medications Ordered in the ED  ketorolac  (TORADOL ) 15 MG/ML injection 15 mg (15 mg Intravenous Given 10/13/23 1447)  prochlorperazine  (COMPAZINE ) injection 10 mg (10 mg Intravenous Given 10/13/23 1446)  diphenhydrAMINE  (BENADRYL ) injection 12.5 mg (12.5 mg Intravenous Given 10/13/23 1445)  acetaminophen  (TYLENOL ) tablet 1,000 mg (1,000 mg Oral Given 10/13/23 1344)  lactated ringers  bolus 1,000 mL (0 mLs Intravenous Stopped 10/13/23 1642)  amoxicillin -clavulanate (AUGMENTIN ) 875-125 MG per tablet 1 tablet (1 tablet Oral Given 10/13/23 1710)                                    Medical Decision Making Amount and/or Complexity of Data Reviewed Labs: ordered. Radiology: ordered.  Risk OTC drugs. Prescription drug management.     Differential diagnosis includes but is not limited to dental abscess or infection, ludwig angina, osteomyelitis, peritonsillar abscess, gingivitis, dental carries, sialadenitis Tension headache, migraine, temporal arteritis, trigeminal neuralgia, cluster headache, meningitis, concussion, intracranial mass, intracranial hemorrhage, carbon monoxide poisoning, sinus  venous thrombosis   ED Course:  Upon initial evaluation, patient is well appearing in no acute distress.  Patient able to open mouth fully with no trismus. Swallowing secretions without difficulty. Uvula midline. No visualized peritonsillar abscess or dental abscess. Neck is soft and supple without overlying skin change, no edema of the submandibular space or tongue, low concern for ludwig angina.  She does have some edema and erythema of the upper gumline where she is missing a tooth.  Suspect this might be the beginning of the dental infection.  Will treat with course of Augmentin . No neurologic deficits noted on exam.  No nuchal rigidity, no fever or chills, no concern for meningitis.  She denies any head trauma, lower concern for skull fracture, intracranial hemorrhage.  However, given headaches have been increasing in severity and frequency, and she has not had a head CT performed, will proceed with head CT for further evaluation.  Labs Ordered: I Ordered, and personally interpreted labs.  The pertinent results include:   CBC and BMP within normal limits Pregnancy test negative  Imaging Studies ordered: I ordered imaging studies including CT head I independently visualized the imaging with scope of interpretation limited to determining acute life threatening conditions related to emergency care. Imaging showed no acute abnormalities I agree with the radiologist interpretation   Medications Given: LR bolus Toradol  Compazine  Benadryl  Tylenol  Augmentin   Upon re-evaluation, patient ports headache has improved to a 3 out of 10 to 10 out of 10 upon arrival.  CT head without any acute abnormalities.  Suspect migraine also dental infection.  Will treat with course of Augmentin . Given improvement with medications, feel she is stable and appropriate for discharge home.  Low concern for any other emergent etiology.    Impression: Migraine headache Dental infection  Disposition:  The  patient was discharged home with instructions to take tylenol  and ibuprofen as needed for pain.  Take course of Augmentin  as prescribed.  With PCP if her headaches are becoming more frequent or not being well-controlled with home medications.  Follow up with their dentist within the next 3 days for recheck of symptoms and further routine care.  Return precautions given.    This chart was dictated using voice recognition software, Dragon. Despite the best efforts of this provider to proofread and correct errors, errors may still occur which can change documentation meaning.       Final diagnoses:  Migraine without aura and without status migrainosus, not intractable    ED Discharge Orders          Ordered    amoxicillin -clavulanate (AUGMENTIN ) 875-125 MG tablet  2 times daily        10/13/23 1654               Veta Palma, PA-C 10/13/23 1717    Tegeler, Lonni PARAS, MD 10/14/23 [phone]

## 2023-10-13 NOTE — Group Note (Deleted)
 Date:  10/13/2023 Time:  2:22 PM  Group Topic/Focus:  Wellness Toolbox:   The focus of this group is to discuss various aspects of wellness, balancing those aspects and exploring ways to increase the ability to experience wellness.  Patients will create a wellness toolbox for use upon discharge.     Participation Level:  {BHH PARTICIPATION OZCZO:77735}  Participation Quality:  {BHH PARTICIPATION QUALITY:22265}  Affect:  {BHH AFFECT:22266}  Cognitive:  {BHH COGNITIVE:22267}  Insight: {BHH Insight2:20797}  Engagement in Group:  {BHH ENGAGEMENT IN HMNLE:77731}  Modes of Intervention:  {BHH MODES OF INTERVENTION:22269}  Additional Comments:  ***  Myra Curtistine BROCKS 10/13/2023, 2:22 PM
